# Patient Record
Sex: Male | Born: 1990 | Race: Black or African American | Hispanic: No | Marital: Married | Smoking: Former smoker
Health system: Southern US, Community
[De-identification: ages and names within clinical notes are randomized; demographics above are authoritative.]

---

## 2019-09-10 ENCOUNTER — Emergency Department
Admission: EM | Admit: 2019-09-10 | Discharge: 2019-09-10 | Disposition: A | Discharge status: Home / Self Care | Payer: Self-pay | Attending: Emergency Medicine | Admitting: Emergency Medicine

## 2019-09-10 ENCOUNTER — Other Ambulatory Visit: Payer: Self-pay

## 2019-09-10 DIAGNOSIS — R079 Chest pain, unspecified: Secondary | ICD-10-CM

## 2019-09-10 DIAGNOSIS — K2900 Acute gastritis without bleeding: Secondary | ICD-10-CM | POA: Insufficient documentation

## 2019-09-10 LAB — COMPREHENSIVE METABOLIC PANEL
ALT: 21 U/L (ref 0–44)
AST: 21 U/L (ref 15–41)
Albumin: 4.1 g/dL (ref 3.5–5.0)
Alkaline Phosphatase: 65 U/L (ref 38–126)
Anion gap: 7 (ref 5–15)
BUN: 11 mg/dL (ref 6–20)
CO2: 28 mmol/L (ref 22–32)
Calcium: 9.1 mg/dL (ref 8.9–10.3)
Chloride: 105 mmol/L (ref 98–111)
Creatinine, Ser: 1.21 mg/dL (ref 0.61–1.24)
GFR calc Af Amer: >60 mL/min (ref >60)
GFR calc non Af Amer: >60 mL/min (ref >60)
Glucose, Bld: 85 mg/dL (ref 70–99)
Potassium: 3.7 mmol/L (ref 3.5–5.1)
Sodium: 140 mmol/L (ref 135–145)
Total Bilirubin: 0.5 mg/dL (ref 0.3–1.2)
Total Protein: 6.9 g/dL (ref 6.5–8.1)

## 2019-09-10 LAB — CBC
HCT: 42.5 % (ref 39.0–52.0)
Hemoglobin: 14.1 g/dL (ref 13.0–17.0)
MCH: 29.7 pg (ref 26.0–34.0)
MCHC: 33.2 g/dL (ref 30.0–36.0)
MCV: 89.5 fL (ref 80.0–100.0)
Platelets: 307 10*3/uL (ref 150–400)
RBC: 4.75 MIL/uL (ref 4.22–5.81)
RDW: 13.4 % (ref 11.5–15.5)
WBC: 5.5 10*3/uL (ref 4.0–10.5)
nRBC: 0 % (ref 0.0–0.2)

## 2019-09-10 LAB — TROPONIN I (HIGH SENSITIVITY): Troponin I (High Sensitivity): 4 ng/L (ref <18)

## 2019-09-10 MED ORDER — PANTOPRAZOLE SODIUM 40 MG PO TBEC: 40.0000 mg | DELAYED_RELEASE_TABLET | Freq: Every day | ORAL | 1 refills | Status: DC

## 2019-09-10 NOTE — ED Provider Notes (Signed)
[facility] Emergency Department Provider Note  Time seen: 8:37 PM  I have reviewed the triage vital signs and the nursing notes.   HISTORY  Chief Complaint Chest Pain   HPI Kevin Mccormick is a 28 y.o. male patient presents to the emergency department for 1 to 2 weeks of intermittent chest pain.  According to the patient for the past 2 weeks or so he has been experiencing mild chest discomfort she describes as a burning sensation worse when he lies down or after eating hot sauce.  Denies any discomfort currently.  Denies any shortness of breath cough or fever.  States symptoms are worse at night and he feels like he has to vomit some mornings.   No past medical history on file.  There are no active problems to display for this patient.    Prior to Admission medications   Not on File    Not on File  No family history on file.  Social History Social History   Tobacco Use  . Smoking status: Not on file  Substance Use Topics  . Alcohol use: Not on file  . Drug use: Not on file    Review of Systems Constitutional: Negative for fever. Cardiovascular: Mild chest burning at night Respiratory: Negative for shortness of breath. Gastrointestinal: Negative for abdominal pain Musculoskeletal: Negative for musculoskeletal complaints Skin: Negative for skin complaints  Neurological: Negative for headache All other ROS negative  ____________________________________________   PHYSICAL EXAM:  VITAL SIGNS: ED Triage Vitals [09/10/19 1948]  Enc Vitals Group     BP (!) 151/78     Pulse Rate 62     Resp 20     Temp 98.5 F (36.9 C)     Temp Source Oral     SpO2 98 %     Weight 185 lb (83.9 kg)     Height 5\' 6"  (1.676 m)     Head Circumference      Peak Flow      Pain Score 3     Pain Loc      Pain Edu?      Excl. in [place]?     Constitutional: Alert and oriented. Well appearing and in no distress. Eyes: Normal exam ENT      Head:  Normocephalic and atraumatic.      Mouth/Throat: Mucous membranes are moist. Cardiovascular: Normal rate, regular rhythm.  Respiratory: Normal respiratory effort without tachypnea nor retractions. Breath sounds are clear Gastrointestinal: Soft and nontender. No distention.   Musculoskeletal: Nontender with normal range of motion in all extremities. Neurologic:  Normal speech and language. No gross focal neurologic deficit Skin:  Skin is warm, dry and intact.  Psychiatric: Mood and affect are normal.   ____________________________________________    EKG  EKG viewed and interpreted by myself shows a normal sinus rhythm at 69 bpm with a narrow QRS, normal axis, normal intervals, no concerning ST changes.  ____________________________________________  INITIAL IMPRESSION / ASSESSMENT AND PLAN / ED COURSE  Pertinent labs & imaging results that were available during my care of the patient were reviewed by me and considered in my medical decision making (see chart for details).   Patient presents emergency department for chest discomfort described as a burning sensation at night when he lies down, or after eating something spicy such as hot sauce per patient.  Patient also states nausea and feels like he needs to spit up or throw up first thing in the morning.  Patient symptoms are  very consistent with gastritis.  We will place the patient on Protonix I also discussed using over-the-counter Maalox after meals and right before going to bed for the next 1 week.  Patient agreeable to plan of care.  Discussed my normal chest pain return precautions.  Kevin Mccormick was evaluated in Emergency Department on 09/10/2019 for the symptoms described in the history of present illness. He was evaluated in the context of the global COVID-19 pandemic, which necessitated consideration that the patient might be at risk for infection with the SARS-CoV-2 virus that causes COVID-19. Institutional protocols and  algorithms that pertain to the evaluation of patients at risk for COVID-19 are in a state of rapid change based on information released by regulatory bodies including the CDC and federal and state organizations. These policies and algorithms were followed during the patient's care in the ED.  ____________________________________________   FINAL CLINICAL IMPRESSION(S) / ED DIAGNOSES  Chest pain Gastritis   Minna Antis, MD 09/10/19 2039

## 2019-09-10 NOTE — ED Triage Notes (Signed)
Pt in with co chest pain states thinks it is reflux because it gets better after taking tums. Denies any hx of reflux or recent illness.

## 2020-03-04 ENCOUNTER — Other Ambulatory Visit: Payer: Self-pay

## 2020-03-04 ENCOUNTER — Emergency Department
Admission: EM | Admit: 2020-03-04 | Discharge: 2020-03-04 | Disposition: A | Discharge status: Home / Self Care | Payer: Self-pay | Attending: Emergency Medicine | Admitting: Emergency Medicine

## 2020-03-04 ENCOUNTER — Emergency Department: Payer: Self-pay

## 2020-03-04 DIAGNOSIS — R05 Cough: Secondary | ICD-10-CM | POA: Insufficient documentation

## 2020-03-04 DIAGNOSIS — R059 Cough, unspecified: Secondary | ICD-10-CM

## 2020-03-04 DIAGNOSIS — Z79899 Other long term (current) drug therapy: Secondary | ICD-10-CM | POA: Insufficient documentation

## 2020-03-04 MED ORDER — METHYLPREDNISOLONE 4 MG PO TBPK: ORAL_TABLET | ORAL | 0 refills | Status: DC

## 2020-03-04 MED ORDER — BENZONATATE 100 MG PO CAPS: 200.0000 mg | ORAL_CAPSULE | Freq: Three times a day (TID) | ORAL | 0 refills | Status: DC | PRN

## 2020-03-04 NOTE — Discharge Instructions (Addendum)
Follow discharge care instruction take medication as directed. °

## 2020-03-04 NOTE — ED Triage Notes (Signed)
Pt c/o a dry cough for the past month and a half, states he has been taking OTC allergy meds with no relief., pt is in NAD.Kevin Mccormick

## 2020-03-04 NOTE — ED Notes (Signed)
See triage note  Presents with dry cough which started several months ago  Denies any fever

## 2020-03-04 NOTE — ED Provider Notes (Signed)
[facility] Emergency Department Provider Note   ____________________________________________   First MD Initiated Contact with Patient 03/04/20 [phone]     (approximate)  I have reviewed the triage vital signs and the nursing notes.   HISTORY  Chief Complaint Cough    HPI Kevin Mccormick is a 29 y.o. male patient complaining nonproductive cough for 1-1/2 months.  Patient states taking over-the-counter allergy medicine with no relief.  Patient denies fever or chills associated complaint.  Denies shortness of breath or chest pain.  Patient denies any other URI signs.  Patient stated tested Covid negative less than 14 days ago.  Denies history of asthma.      History reviewed. No pertinent past medical history.  There are no problems to display for this patient.   History reviewed. No pertinent surgical history.  Prior to Admission medications   Medication Sig Start Date End Date Taking? Authorizing Provider  benzonatate (TESSALON PERLES) 100 MG capsule Take 2 capsules (200 mg total) by mouth 3 (three) times daily as needed. 03/04/20 03/04/21  Joni Reining, PA-C  methylPREDNISolone (MEDROL DOSEPAK) 4 MG TBPK tablet Take Tapered dose as directed 03/04/20   Joni Reining, PA-C  pantoprazole (PROTONIX) 40 MG tablet Take 1 tablet (40 mg total) by mouth daily. 09/10/19 09/09/20  Minna Antis, MD    Allergies Patient has no known allergies.  No family history on file.  Social History Social History   Tobacco Use  . Smoking status: Never Smoker  . Smokeless tobacco: Never Used  Substance Use Topics  . Alcohol use: Not Currently  . Drug use: Not Currently    Review of Systems Constitutional: No fever/chills Eyes: No visual changes. ENT: No sore throat. Cardiovascular: Denies chest pain. Respiratory: Denies shortness of breath.  Nonproductive cough. Gastrointestinal: No abdominal pain.  No nausea, no vomiting.  No diarrhea.  No  constipation. Genitourinary: Negative for dysuria. Musculoskeletal: Negative for back pain. Skin: Negative for rash. Neurological: Negative for headaches, focal weakness or numbness.   ____________________________________________   PHYSICAL EXAM:  VITAL SIGNS: ED Triage Vitals  Enc Vitals Group     BP 03/04/20 0935 130/75     Pulse Rate 03/04/20 0935 65     Resp 03/04/20 0935 15     Temp 03/04/20 0935 97.7 F (36.5 C)     Temp Source 03/04/20 0935 Oral     SpO2 03/04/20 0935 99 %     Weight 03/04/20 0936 180 lb (81.6 kg)     Height 03/04/20 0936 5\' 6"  (1.676 m)     Head Circumference --      Peak Flow --      Pain Score 03/04/20 0936 0     Pain Loc --      Pain Edu? --      Excl. in [place]? --    Constitutional: Alert and oriented. Well appearing and in no acute distress. Nose: No congestion/rhinnorhea. Mouth/Throat: Mucous membranes are moist.  Oropharynx non-erythematous. Neck: No stridor.  Hematological/Lymphatic/Immunilogical: No cervical lymphadenopathy. Cardiovascular: Normal rate, regular rhythm. Grossly normal heart sounds.  Good peripheral circulation. Respiratory: Normal respiratory effort.  No retractions. Lungs CTAB. Neurologic:  Normal speech and language. No gross focal neurologic deficits are appreciated. No gait instability. Skin:  Skin is warm, dry and intact. No rash noted. Psychiatric: Mood and affect are normal. Speech and behavior are normal.  ____________________________________________   LABS (all labs ordered are listed, but only abnormal results are displayed)  Labs Reviewed -  No data to display ____________________________________________  EKG   ____________________________________________  RADIOLOGY  ED MD interpretation:    Official radiology report(s): DG Chest 2 View  Result Date: 03/04/2020 CLINICAL DATA:  Cough for 1 month EXAM: CHEST - 2 VIEW COMPARISON:  None. FINDINGS: There is slight scarring in the right upper lobe. Lungs  elsewhere are clear. Heart size and pulmonary vascularity are normal. No adenopathy. No bone lesions. IMPRESSION: Slight scarring right upper lobe. Lungs elsewhere clear. Cardiac silhouette normal. Electronically Signed   By: Lowella Grip III M.D.   On: 03/04/2020 10:29    ____________________________________________   PROCEDURES  Procedure(s) performed (including Critical Care):  Procedures   ____________________________________________   INITIAL IMPRESSION / ASSESSMENT AND PLAN / ED COURSE  As part of my medical decision making, I reviewed the following data within the [place]     Patient presents with nonproductive cough for 1/2 months.  Physical exam was unremarkable.  No coughing throughout exam.  Discussed x-ray findings with patient showing mild scarring in the right upper lobe.  Patient given discharge care instruction.  Patient given a prescription for Tessalon and Medrol Dosepak.  Patient advised [facility] care with [facility].    Kevin Mccormick was evaluated in Emergency Department on 03/04/2020 for the symptoms described in the history of present illness. He was evaluated in the context of the global COVID-19 pandemic, which necessitated consideration that the patient might be at risk for infection with the SARS-CoV-2 virus that causes COVID-19. Institutional protocols and algorithms that pertain to the evaluation of patients at risk for COVID-19 are in a state of rapid change based on information released by regulatory bodies including the CDC and federal and state organizations. These policies and algorithms were followed during the patient's care in the ED.       ____________________________________________   FINAL CLINICAL IMPRESSION(S) / ED DIAGNOSES  Final diagnoses:  Cough     ED Discharge Orders         Ordered    methylPREDNISolone (MEDROL DOSEPAK) 4 MG TBPK tablet     03/04/20 1042    benzonatate (TESSALON PERLES) 100 MG  capsule  3 times daily PRN     03/04/20 1042           Note:  This document was prepared using Dragon voice recognition software and may include unintentional dictation errors.    Sable Feil, PA-C 03/04/20 1044    Arta Silence, MD 03/04/20 [phone]

## 2020-03-10 ENCOUNTER — Emergency Department: Payer: Self-pay

## 2020-03-10 ENCOUNTER — Encounter: Payer: Self-pay | Admitting: Emergency Medicine

## 2020-03-10 ENCOUNTER — Emergency Department
Admission: EM | Admit: 2020-03-10 | Discharge: 2020-03-10 | Disposition: A | Discharge status: Home / Self Care | Payer: Self-pay | Attending: Emergency Medicine | Admitting: Emergency Medicine

## 2020-03-10 ENCOUNTER — Other Ambulatory Visit: Payer: Self-pay

## 2020-03-10 DIAGNOSIS — Z20822 Contact with and (suspected) exposure to covid-19: Secondary | ICD-10-CM | POA: Insufficient documentation

## 2020-03-10 DIAGNOSIS — J029 Acute pharyngitis, unspecified: Secondary | ICD-10-CM

## 2020-03-10 DIAGNOSIS — Z79899 Other long term (current) drug therapy: Secondary | ICD-10-CM | POA: Insufficient documentation

## 2020-03-10 DIAGNOSIS — J069 Acute upper respiratory infection, unspecified: Secondary | ICD-10-CM

## 2020-03-10 LAB — GROUP A STREP BY PCR: Group A Strep by PCR: NOT DETECTED

## 2020-03-10 LAB — SARS CORONAVIRUS 2 (TAT 6-24 HRS): SARS Coronavirus 2: NEGATIVE

## 2020-03-10 MED ORDER — LIDOCAINE VISCOUS HCL 2 % MT SOLN: 5.0000 mL | Freq: Four times a day (QID) | OROMUCOSAL | 0 refills | Status: DC | PRN

## 2020-03-10 MED ORDER — PSEUDOEPH-BROMPHEN-DM 30-2-10 MG/5ML PO SYRP: 5.0000 mL | ORAL_SOLUTION | Freq: Four times a day (QID) | ORAL | 0 refills | Status: DC | PRN

## 2020-03-10 NOTE — ED Notes (Signed)
See triage note  Presents with sore throat and joint pain   States he was placed on prednisone last week but stopped taking it  B/c it was bothering his joints   States he now has a sore throat afebrile on arrival

## 2020-03-10 NOTE — ED Triage Notes (Signed)
Pt reports his daughter has strep throat and he wants to be checked for it. Pt c/o sore throat, denies fever. Pt also reports he has been on a steroid for about 2 weeks and wants the MD to take him off of it. Pt also reports he was given a cough medication 1-2 weeks ago and it is not working so he needs something else.

## 2020-03-10 NOTE — ED Provider Notes (Signed)
[facility] Emergency Department Provider Note   ____________________________________________   First MD Initiated Contact with Patient 03/10/20 0815     (approximate)  I have reviewed the triage vital signs and the nursing notes.   HISTORY  Chief Complaint Sore Throat, Allergic Reaction, and Cough    HPI Kevin Mccormick is a 29 y.o. male patient complain of sore throat for approximately 1 week.  Patient states his daughter was diagnosed with strep pharyngitis.  Patient also complained of persisting cough since last visit on March 04, 2020.  Patient state cannot tolerate steroids that were given yesterday's visit.  Patient also states no relief with Tessalon Perles.  Patient denies recent travel or exposure to COVID-19.         History reviewed. No pertinent past medical history.  There are no problems to display for this patient.   History reviewed. No pertinent surgical history.  Prior to Admission medications   Medication Sig Start Date End Date Taking? Authorizing Provider  benzonatate (TESSALON PERLES) 100 MG capsule Take 2 capsules (200 mg total) by mouth 3 (three) times daily as needed. 03/04/20 03/04/21  Sable Feil, PA-C  brompheniramine-pseudoephedrine-DM 30-2-10 MG/5ML syrup Take 5 mLs by mouth 4 (four) times daily as needed. Mix with 5 mL of viscous lidocaine for swish and swallow 03/10/20   Sable Feil, PA-C  lidocaine (XYLOCAINE) 2 % solution Use as directed 5 mLs in the mouth or throat every 6 (six) hours as needed for mouth pain. Mix with 5 mL of Bromfed-DM for swish and swallow. 03/10/20   Sable Feil, PA-C  methylPREDNISolone (MEDROL DOSEPAK) 4 MG TBPK tablet Take Tapered dose as directed 03/04/20   Sable Feil, PA-C  pantoprazole (PROTONIX) 40 MG tablet Take 1 tablet (40 mg total) by mouth daily. 09/10/19 09/09/20  Harvest Dark, MD    Allergies Patient has no known allergies.  No family history on file.  Social  History Social History   Tobacco Use  . Smoking status: Never Smoker  . Smokeless tobacco: Never Used  Substance Use Topics  . Alcohol use: Not Currently  . Drug use: Not Currently    Review of Systems Constitutional: No fever/chills Eyes: No visual changes. ENT: No sore throat. Cardiovascular: Denies chest pain. Respiratory: Denies shortness of breath.  Nonproductive cough. Gastrointestinal: No abdominal pain.  No nausea, no vomiting.  No diarrhea.  No constipation. Genitourinary: Negative for dysuria. Musculoskeletal: Negative for back pain. Skin: Negative for rash. Neurological: Negative for headaches, focal weakness or numbness.   ____________________________________________   PHYSICAL EXAM:  VITAL SIGNS: ED Triage Vitals  Enc Vitals Group     BP 03/10/20 0806 134/82     Pulse Rate 03/10/20 0806 70     Resp 03/10/20 0806 16     Temp 03/10/20 0806 98.4 F (36.9 C)     Temp src --      SpO2 03/10/20 0806 99 %     Weight 03/10/20 0802 180 lb (81.6 kg)     Height 03/10/20 0802 5\' 6"  (1.676 m)     Head Circumference --      Peak Flow --      Pain Score 03/10/20 0802 3     Pain Loc --      Pain Edu? --      Excl. in [place]? --     Constitutional: Alert and oriented. Well appearing and in no acute distress. Nose: No congestion/rhinnorhea.  Clear rhinorrhea. Mouth/Throat: Mucous  membranes are moist.  Oropharynx non-erythematous.  Postnasal drainage. Neck: No stridor.   Hematological/Lymphatic/Immunilogical: No cervical lymphadenopathy. Cardiovascular: Normal rate, regular rhythm. Grossly normal heart sounds.  Good peripheral circulation. Respiratory: Normal respiratory effort.  No retractions. Lungs CTAB. Skin:  Skin is warm, dry and intact. No rash noted. Psychiatric: Mood and affect are normal. Speech and behavior are normal.  ____________________________________________   LABS (all labs ordered are listed, but only abnormal results are displayed)  Labs  Reviewed  GROUP A STREP BY PCR  SARS CORONAVIRUS 2 (TAT 6-24 HRS)   ____________________________________________  EKG   ____________________________________________  RADIOLOGY  ED MD interpretation:    Official radiology report(s): DG Chest Portable 1 View  Result Date: 03/10/2020 CLINICAL DATA:  Cough EXAM: PORTABLE CHEST 1 VIEW COMPARISON:  March 04, 2020 FINDINGS: The lungs are clear. Heart size and pulmonary vascularity are normal. No adenopathy. No bone lesions. IMPRESSION: Lungs clear.  Cardiac silhouette. Electronically Signed   By: Bretta Bang III M.D.   On: 03/10/2020 08:36    ____________________________________________   PROCEDURES  Procedure(s) performed (including Critical Care):  Procedures   ____________________________________________   INITIAL IMPRESSION / ASSESSMENT AND PLAN / ED COURSE  As part of my medical decision making, I reviewed the following data within the electronic MEDICAL RECORD NUMBER     Patient presents with sore throat secondary to strep exposure.  Patient also complaining of persistent nonproductive cough since last visit.  Discussed negative strep results with patient.  Patient feels exam consistent with viral pharyngitis and respiratory infection.  Patient given discharge care instruct advised take medication as directed.  Patient advised [facility] with [facility].  Patient advised self quarantine pending results of COVID-19 test.  If test is positive was quarantine additional 10 days.    Kevin Mccormick was evaluated in Emergency Department on 03/10/2020 for the symptoms described in the history of present illness. He was evaluated in the context of the global COVID-19 pandemic, which necessitated consideration that the patient might be at risk for infection with the SARS-CoV-2 virus that causes COVID-19. Institutional protocols and algorithms that pertain to the evaluation of patients at risk for COVID-19 are in a state of  rapid change based on information released by regulatory bodies including the CDC and federal and state organizations. These policies and algorithms were followed during the patient's care in the ED.       ____________________________________________   FINAL CLINICAL IMPRESSION(S) / ED DIAGNOSES  Final diagnoses:  Viral URI with cough  Sore throat     ED Discharge Orders         Ordered    brompheniramine-pseudoephedrine-DM 30-2-10 MG/5ML syrup  4 times daily PRN     03/10/20 0920    lidocaine (XYLOCAINE) 2 % solution  Every 6 hours PRN     03/10/20 0920           Note:  This document was prepared using Dragon voice recognition software and may include unintentional dictation errors.    Joni Reining, PA-C 03/10/20 1884    Minna Antis, MD 03/10/20 1350

## 2020-03-10 NOTE — Discharge Instructions (Signed)
Follow discharge care instruction take medication as directed.  Advised self quarantine pending results of COVID-19 test.  If test is positive was quarantine additional 10 days.  You will be notified telephonically if test is positive.  You may also follow the results of your COVID-19 testing in the MyChart app.

## 2020-04-04 ENCOUNTER — Other Ambulatory Visit: Payer: Self-pay

## 2020-04-04 ENCOUNTER — Encounter: Payer: Self-pay | Admitting: Pulmonary Disease

## 2020-04-04 ENCOUNTER — Ambulatory Visit (INDEPENDENT_AMBULATORY_CARE_PROVIDER_SITE_OTHER): Payer: Self-pay | Admitting: Pulmonary Disease

## 2020-04-04 VITALS — BP 110/70 | HR 67 | Temp 97.0°F | Ht 66.0 in | Wt 178.4 lb

## 2020-04-04 DIAGNOSIS — R059 Cough, unspecified: Secondary | ICD-10-CM

## 2020-04-04 DIAGNOSIS — R05 Cough: Secondary | ICD-10-CM

## 2020-04-04 NOTE — Progress Notes (Signed)
Kevin Mccormick    [id number]    January 28, 1991  Primary Care Physician:Patient, No Pcp Per  Referring Physician: No referring provider defined for this encounter.  Chief complaint:   Patient with a history of recent shortness of breath   HPI:  Possible pneumonia 3 months ago Was in the emergency room for shortness of breath where he was treated with steroids, cough suppressants  He is feeling better Cough is better Activity level is better  Denies underlying lung disease no history of asthma  Was not on any medications prior  Claims this quit smoking many years ago over 6-7 years ago  No family history of lung disease No significant work exposures-he works at [employer] zone, he does wear mask  Outpatient Encounter Medications as of 04/04/2020  Medication Sig  . benzonatate (TESSALON PERLES) 100 MG capsule Take 2 capsules (200 mg total) by mouth 3 (three) times daily as needed.  . brompheniramine-pseudoephedrine-DM 30-2-10 MG/5ML syrup Take 5 mLs by mouth 4 (four) times daily as needed. Mix with 5 mL of viscous lidocaine for swish and swallow  . lidocaine (XYLOCAINE) 2 % solution Use as directed 5 mLs in the mouth or throat every 6 (six) hours as needed for mouth pain. Mix with 5 mL of Bromfed-DM for swish and swallow.  . methylPREDNISolone (MEDROL DOSEPAK) 4 MG TBPK tablet Take Tapered dose as directed  . pantoprazole (PROTONIX) 40 MG tablet Take 1 tablet (40 mg total) by mouth daily.   No facility-administered encounter medications on file as of 04/04/2020.    Allergies as of 04/04/2020  . (No Known Allergies)    History reviewed. No pertinent past medical history.  History reviewed. No pertinent surgical history.  History reviewed. No pertinent family history.  Social History   Socioeconomic History  . Marital status: Married    Spouse name: Not on file  . Number of children: Not on file  . Years of education: Not on file  . Highest education level: Not on  file  Occupational History  . Not on file  Tobacco Use  . Smoking status: Current Every Day Smoker  . Smokeless tobacco: Never Used  Substance and Sexual Activity  . Alcohol use: Not Currently  . Drug use: Not Currently  . Sexual activity: Not on file  Other Topics Concern  . Not on file  Social History Narrative  . Not on file   Social Determinants of Health   Financial Resource Strain:   . Difficulty of Paying Living Expenses:   Food Insecurity:   . Worried About Charity fundraiser in the Last Year:   . Arboriculturist in the Last Year:   Transportation Needs:   . Film/video editor (Medical):   Marland Kitchen Lack of Transportation (Non-Medical):   Physical Activity:   . Days of Exercise per Week:   . Minutes of Exercise per Session:   Stress:   . Feeling of Stress :   Social Connections:   . Frequency of Communication with Friends and Family:   . Frequency of Social Gatherings with Friends and Family:   . Attends Religious Services:   . Active Member of Clubs or Organizations:   . Attends Archivist Meetings:   Marland Kitchen Marital Status:   Intimate Partner Violence:   . Fear of Current or Ex-Partner:   . Emotionally Abused:   Marland Kitchen Physically Abused:   . Sexually Abused:     Review of  Systems  Respiratory: Positive for cough and shortness of breath.   All other systems reviewed and are negative.   Vitals:   04/04/20 0939  BP: 110/70  Pulse: 67  Temp: (!) 97 F (36.1 C)  SpO2: 97%     Physical Exam  Constitutional: He appears well-developed and well-nourished.  HENT:  Head: Normocephalic and atraumatic.  Eyes: Pupils are equal, round, and reactive to light.  Neck: No thyromegaly present.  Cardiovascular: Normal rate and regular rhythm.  Pulmonary/Chest: Effort normal and breath sounds normal. No respiratory distress. He has no wheezes. He has no rales. He exhibits no tenderness.   Data Reviewed: Recent chest x-ray from 03/10/2020 reviewed by  myself  Assessment:  Cough -Post infectious cough -Improvement in symptoms  Plan/Recommendations: Marland Kitchen  May use Delsym-over-the-counter cough medicine to help with cough suppression .  If he requires any other medication for his cough, encouraged to call  .  If symptoms do not continue to improve will be glad to follow-up in a few weeks .  For now follow-up as needed  .  Graded exercises .  Avoid situations that may predispose to lung disease    Virl Diamond MD [facility] 04/04/2020, 9:59 AM  CC: No ref. provider found

## 2020-04-04 NOTE — Patient Instructions (Signed)
Cough No concerning findings on your chest x-ray  Symptoms are improving at present time Continue to monitor symptoms closely  Delsym-over-the-counter cough medicine may help suppress cough  Call with significant concerns  I will see you as needed

## 2020-05-23 ENCOUNTER — Other Ambulatory Visit: Payer: Self-pay

## 2020-05-23 ENCOUNTER — Emergency Department
Admission: EM | Admit: 2020-05-23 | Discharge: 2020-05-23 | Disposition: A | Discharge status: Home / Self Care | Payer: Self-pay | Attending: Emergency Medicine | Admitting: Emergency Medicine

## 2020-05-23 DIAGNOSIS — M67432 Ganglion, left wrist: Secondary | ICD-10-CM | POA: Insufficient documentation

## 2020-05-23 DIAGNOSIS — F17200 Nicotine dependence, unspecified, uncomplicated: Secondary | ICD-10-CM | POA: Insufficient documentation

## 2020-05-23 NOTE — Discharge Instructions (Addendum)
Wear the wrist splint to calm down the inflammation around the ganglionic cyst.  Take Tylenol or ibuprofen for pain if needed.  Apply ice.  Return if worsening.  Follow-up with orthopedics if not improving in 5 days

## 2020-05-23 NOTE — ED Provider Notes (Signed)
[facility] Emergency Department Provider Note  ____________________________________________   First MD Initiated Contact with Patient 05/23/20 2210     (approximate)  I have reviewed the triage vital signs and the nursing notes.   HISTORY  Chief Complaint Cyst    HPI Kevin Mccormick is a 29 y.o. male presents emergency department complaining of left wrist pain.  Patient has history of ganglion cyst is gotten a little larger recently.  Patient states that is pulling on the muscles that lead into his armpit.  Sometimes his fingers will go numb.  Pain is rated at  8 of 10   History reviewed. No pertinent past medical history.  There are no problems to display for this patient.   History reviewed. No pertinent surgical history.  Prior to Admission medications   Medication Sig Start Date End Date Taking? Authorizing Provider  pantoprazole (PROTONIX) 40 MG tablet Take 1 tablet (40 mg total) by mouth daily. 09/10/19 05/23/20  Minna Antis, MD    Allergies Patient has no known allergies.  No family history on file.  Social History Social History   Tobacco Use  . Smoking status: Current Every Day Smoker  . Smokeless tobacco: Never Used  Substance Use Topics  . Alcohol use: Not Currently  . Drug use: Not Currently    Review of Systems  Constitutional: No fever/chills Eyes: No visual changes. ENT: No sore throat. Respiratory: Denies cough Genitourinary: Negative for dysuria. Musculoskeletal: Negative for back pain.  Positive for left wrist pain Skin: Negative for rash. Psychiatric: no mood changes,     ____________________________________________   PHYSICAL EXAM:  VITAL SIGNS: ED Triage Vitals [05/23/20 2202]  Enc Vitals Group     BP 133/72     Pulse Rate 69     Resp 16     Temp 98 F (36.7 C)     Temp src      SpO2 98 %     Weight 180 lb (81.6 kg)     Height 5\' 6"  (1.676 m)     Head Circumference      Peak Flow       Pain Score 8     Pain Loc      Pain Edu?      Excl. in GC?     Constitutional: Alert and oriented. Well appearing and in no acute distress. Eyes: Conjunctivae are normal.  Head: Atraumatic. Nose: No congestion/rhinnorhea. Mouth/Throat: Mucous membranes are moist.   Neck:  supple no lymphadenopathy noted Cardiovascular: Normal rate, regular rhythm.  Respiratory: Normal respiratory effort.  No retractions, GU: deferred Musculoskeletal: FROM all extremities, warm and well perfused, positive for ganglion cyst noted to the dorsum of the left wrist, area is mobile and tender, neurovascular is intact, no edema noted distally.  Left axillary area does not show any nodes or tenderness into the shoulder. Neurologic:  Normal speech and language.  Skin:  Skin is warm, dry and intact. No rash noted. Psychiatric: Mood and affect are normal. Speech and behavior are normal.  ____________________________________________   LABS (all labs ordered are listed, but only abnormal results are displayed)  Labs Reviewed - No data to display ____________________________________________   ____________________________________________  RADIOLOGY    ____________________________________________   PROCEDURES  Procedure(s) performed: Wrist splint applied by nursing staff  Procedures    ____________________________________________   INITIAL IMPRESSION / ASSESSMENT AND PLAN / ED COURSE  Pertinent labs & imaging results that were available during my care of the patient were  reviewed by me and considered in my medical decision making (see chart for details).   Patient is 29 year old male presents emergency department left wrist pain.  See HPI  Physical exam is consistent with a ganglion cyst.  I did explain everything to the patient.  Is placed in a wrist splint today help alleviate some of the discomfort.  He is to follow-up with orthopedics.  He is to take over-the-counter ibuprofen or Tylenol  for pain.  Return if worsening.  He was discharged stable condition.    Kevin Mccormick was evaluated in Emergency Department on 05/23/2020 for the symptoms described in the history of present illness. He was evaluated in the context of the global COVID-19 pandemic, which necessitated consideration that the patient might be at risk for infection with the SARS-CoV-2 virus that causes COVID-19. Institutional protocols and algorithms that pertain to the evaluation of patients at risk for COVID-19 are in a state of rapid change based on information released by regulatory bodies including the CDC and federal and state organizations. These policies and algorithms were followed during the patient's care in the ED.   As part of my medical decision making, I reviewed the following data within the [place] notes reviewed and incorporated, Old chart reviewed, Notes from prior ED visits and [place] Controlled Substance Database  ____________________________________________   FINAL CLINICAL IMPRESSION(S) / ED DIAGNOSES  Final diagnoses:  Ganglion of left wrist      NEW MEDICATIONS STARTED DURING THIS VISIT:  Current Discharge Medication List       Note:  This document was prepared using Dragon voice recognition software and may include unintentional dictation errors.    Versie Starks, PA-C 05/23/20 2224    Nance Pear, MD 05/23/20 2236

## 2020-05-23 NOTE — ED Triage Notes (Signed)
Pt to the er for a cyst to the left wrist with pain. Pt states he feels like he pulled a muscle under his arm as well.

## 2020-05-27 ENCOUNTER — Emergency Department: Payer: Worker's Compensation

## 2020-05-27 ENCOUNTER — Other Ambulatory Visit: Payer: Self-pay

## 2020-05-27 ENCOUNTER — Emergency Department
Admission: EM | Admit: 2020-05-27 | Discharge: 2020-05-27 | Disposition: A | Discharge status: Home / Self Care | Payer: Worker's Compensation | Attending: Emergency Medicine | Admitting: Emergency Medicine

## 2020-05-27 DIAGNOSIS — Y92410 Unspecified street and highway as the place of occurrence of the external cause: Secondary | ICD-10-CM | POA: Insufficient documentation

## 2020-05-27 DIAGNOSIS — Y999 Unspecified external cause status: Secondary | ICD-10-CM | POA: Diagnosis not present

## 2020-05-27 DIAGNOSIS — M545 Low back pain: Secondary | ICD-10-CM | POA: Insufficient documentation

## 2020-05-27 DIAGNOSIS — M546 Pain in thoracic spine: Secondary | ICD-10-CM | POA: Diagnosis not present

## 2020-05-27 DIAGNOSIS — F17200 Nicotine dependence, unspecified, uncomplicated: Secondary | ICD-10-CM | POA: Diagnosis not present

## 2020-05-27 DIAGNOSIS — M542 Cervicalgia: Secondary | ICD-10-CM | POA: Insufficient documentation

## 2020-05-27 DIAGNOSIS — Y939 Activity, unspecified: Secondary | ICD-10-CM | POA: Diagnosis not present

## 2020-05-27 DIAGNOSIS — Z7901 Long term (current) use of anticoagulants: Secondary | ICD-10-CM | POA: Diagnosis not present

## 2020-05-27 MED ORDER — METHOCARBAMOL 500 MG PO TABS: 500.0000 mg | ORAL_TABLET | Freq: Three times a day (TID) | ORAL | 0 refills | Status: AC | PRN

## 2020-05-27 MED ORDER — MELOXICAM 15 MG PO TABS: 15.0000 mg | ORAL_TABLET | Freq: Every day | ORAL | 1 refills | Status: AC

## 2020-05-27 NOTE — ED Notes (Signed)
Spoke to Art therapist from [employer] upon request only urine drug screen WC.
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
## 2020-05-27 NOTE — Discharge Instructions (Signed)
Take meloxicam and Robaxin for pain and inflammation. °
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
## 2020-06-03 ENCOUNTER — Emergency Department
Admission: EM | Admit: 2020-06-03 | Discharge: 2020-06-03 | Disposition: A | Discharge status: Home / Self Care | Payer: Worker's Compensation | Attending: Emergency Medicine | Admitting: Emergency Medicine

## 2020-06-03 DIAGNOSIS — M62830 Muscle spasm of back: Secondary | ICD-10-CM | POA: Insufficient documentation

## 2020-06-03 DIAGNOSIS — M25519 Pain in unspecified shoulder: Secondary | ICD-10-CM | POA: Insufficient documentation

## 2020-06-03 DIAGNOSIS — M546 Pain in thoracic spine: Secondary | ICD-10-CM | POA: Diagnosis not present

## 2020-06-03 DIAGNOSIS — M549 Dorsalgia, unspecified: Secondary | ICD-10-CM

## 2020-06-03 DIAGNOSIS — M7918 Myalgia, other site: Secondary | ICD-10-CM

## 2020-06-03 MED ORDER — DIAZEPAM 5 MG PO TABS: 5.0000 mg | ORAL_TABLET | Freq: Three times a day (TID) | ORAL | 0 refills | Status: DC | PRN

## 2020-06-03 MED ORDER — DIAZEPAM 5 MG PO TABS
5.0000 mg | ORAL_TABLET | Freq: Once | ORAL | Status: AC
  Administered 2020-06-03: 5 mg via ORAL
  Filled 2020-06-03: qty 1
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
## 2020-06-17 ENCOUNTER — Telehealth: Payer: Self-pay | Admitting: General Practice

## 2020-06-17 NOTE — Telephone Encounter (Signed)
Individual has been contacted 3+ times regarding ED referral. No further attempts to contact individual will be made. 

## 2020-08-20 ENCOUNTER — Emergency Department
Admission: EM | Admit: 2020-08-20 | Discharge: 2020-08-20 | Disposition: A | Discharge status: Home / Self Care | Payer: Self-pay | Attending: Student in an Organized Health Care Education/Training Program | Admitting: Student in an Organized Health Care Education/Training Program

## 2020-08-20 ENCOUNTER — Emergency Department: Payer: Self-pay

## 2020-08-20 ENCOUNTER — Encounter: Payer: Self-pay | Admitting: Emergency Medicine

## 2020-08-20 ENCOUNTER — Other Ambulatory Visit: Payer: Self-pay

## 2020-08-20 DIAGNOSIS — F17210 Nicotine dependence, cigarettes, uncomplicated: Secondary | ICD-10-CM | POA: Insufficient documentation

## 2020-08-20 DIAGNOSIS — G8929 Other chronic pain: Secondary | ICD-10-CM | POA: Insufficient documentation

## 2020-08-20 DIAGNOSIS — M25512 Pain in left shoulder: Secondary | ICD-10-CM | POA: Insufficient documentation

## 2020-08-20 MED ORDER — CYCLOBENZAPRINE HCL 5 MG PO TABS: ORAL_TABLET | ORAL | 0 refills | Status: DC

## 2020-08-20 NOTE — Discharge Instructions (Signed)
You may have torn a muscle in your shoulder.  You can apply heat to your shoulder and take muscle relaxers.  Do not drive or work while taking muscle relaxers.  Please call orthopedics today for a follow-up appointment for a further work-up.
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
## 2020-08-20 NOTE — ED Triage Notes (Signed)
Presents with pain to left shoulder  Denies any injury  States developed pain about 6 months ago  No deformity noted  Good pulses

## 2021-05-04 ENCOUNTER — Emergency Department
Admission: EM | Admit: 2021-05-04 | Discharge: 2021-05-04 | Disposition: A | Discharge status: Home / Self Care | Payer: Self-pay | Attending: Emergency Medicine | Admitting: Emergency Medicine

## 2021-05-04 ENCOUNTER — Emergency Department: Payer: Self-pay

## 2021-05-04 ENCOUNTER — Other Ambulatory Visit: Payer: Self-pay

## 2021-05-04 ENCOUNTER — Encounter: Payer: Self-pay | Admitting: Emergency Medicine

## 2021-05-04 DIAGNOSIS — Z20822 Contact with and (suspected) exposure to covid-19: Secondary | ICD-10-CM | POA: Insufficient documentation

## 2021-05-04 DIAGNOSIS — Z28310 Unvaccinated for covid-19: Secondary | ICD-10-CM | POA: Insufficient documentation

## 2021-05-04 DIAGNOSIS — J069 Acute upper respiratory infection, unspecified: Secondary | ICD-10-CM | POA: Insufficient documentation

## 2021-05-04 DIAGNOSIS — F17200 Nicotine dependence, unspecified, uncomplicated: Secondary | ICD-10-CM | POA: Insufficient documentation

## 2021-05-04 LAB — SARS CORONAVIRUS 2 (TAT 6-24 HRS): SARS Coronavirus 2: NEGATIVE

## 2021-05-04 MED ORDER — IBUPROFEN 800 MG PO TABS: 800.0000 mg | ORAL_TABLET | Freq: Three times a day (TID) | ORAL | 0 refills | Status: DC | PRN

## 2021-05-04 MED ORDER — PSEUDOEPH-BROMPHEN-DM 30-2-10 MG/5ML PO SYRP: 5.0000 mL | ORAL_SOLUTION | Freq: Four times a day (QID) | ORAL | 0 refills | Status: DC | PRN

## 2021-05-04 NOTE — ED Provider Notes (Signed)
[facility] Emergency Department Provider Note   ____________________________________________   Event Date/Time   First MD Initiated Contact with Patient 05/04/21 1019     (approximate)  I have reviewed the triage vital signs and the nursing notes.   HISTORY  Chief Complaint URI    HPI Kevin Mccormick is a 30 y.o. male patient plan nonproductive cough for 2 weeks.  Patient states cough has been intermitting.  Patient stated last night he developed body aches and fever.  No known contact with COVID-19.  No recent travel.  Patient has not taken the vaccine for COVID-19 or influenza.  Patient rates his pain/discomfort a 5/10.  Patient describes his pain/discomfort as "achy".  No palliative measure for complaint.         History reviewed. No pertinent past medical history.  There are no problems to display for this patient.   History reviewed. No pertinent surgical history.  Prior to Admission medications   Medication Sig Start Date End Date Taking? Authorizing Provider  brompheniramine-pseudoephedrine-DM 30-2-10 MG/5ML syrup Take 5 mLs by mouth 4 (four) times daily as needed. 05/04/21  Yes Joni Reining, PA-C  ibuprofen (ADVIL) 800 MG tablet Take 1 tablet (800 mg total) by mouth every 8 (eight) hours as needed. 05/04/21  Yes Joni Reining, PA-C  pantoprazole (PROTONIX) 40 MG tablet Take 1 tablet (40 mg total) by mouth daily. 09/10/19 05/23/20  Minna Antis, MD    Allergies Patient has no known allergies.  No family history on file.  Social History Social History   Tobacco Use  . Smoking status: Current Every Day Smoker  . Smokeless tobacco: Never Used  Substance Use Topics  . Alcohol use: Not Currently  . Drug use: Not Currently    Review of Systems Constitutional: Fever and body aches. Eyes: No visual changes. ENT: No sore throat. Cardiovascular: Denies chest pain. Respiratory: Denies shortness of breath.  Nonproductive  cough. Gastrointestinal: No abdominal pain.  No nausea, no vomiting.  No diarrhea.  No constipation. Genitourinary: Negative for dysuria. Musculoskeletal: Negative for back pain. Skin: Negative for rash. Neurological: Negative for headaches, focal weakness or numbness.   ____________________________________________   PHYSICAL EXAM:  VITAL SIGNS: ED Triage Vitals  Enc Vitals Group     BP 05/04/21 0832 113/76     Pulse Rate 05/04/21 0832 99     Resp 05/04/21 0832 16     Temp 05/04/21 0832 99.1 F (37.3 C)     Temp Source 05/04/21 0832 Oral     SpO2 05/04/21 0832 100 %     Weight 05/04/21 0821 180 lb 12.4 oz (82 kg)     Height 05/04/21 0821 5\' 6"  (1.676 m)     Head Circumference --      Peak Flow --      Pain Score 05/04/21 0820 5     Pain Loc --      Pain Edu? --      Excl. in GC? --    Constitutional: Alert and oriented. Well appearing and in no acute distress. Eyes: Conjunctivae are normal. PERRL. EOMI. Head: Atraumatic. Nose: No congestion/rhinnorhea. Mouth/Throat: Mucous membranes are moist.  Oropharynx non-erythematous. Neck: No stridor. Hematological/Lymphatic/Immunilogical: No cervical lymphadenopathy. Cardiovascular: Normal rate, regular rhythm. Grossly normal heart sounds.  Good peripheral circulation. Respiratory: Normal respiratory effort.  No retractions. Lungs CTAB. Gastrointestinal: Soft and nontender. No distention. No abdominal bruits. No CVA tenderness. Musculoskeletal: No lower extremity tenderness nor edema.  No joint effusions. Neurologic:  Normal speech and language. No gross focal neurologic deficits are appreciated. No gait instability. Skin:  Skin is warm, dry and intact. No rash noted. Psychiatric: Mood and affect are normal. Speech and behavior are normal.  ____________________________________________   LABS (all labs ordered are listed, but only abnormal results are displayed)  Labs Reviewed  SARS CORONAVIRUS 2 (TAT 6-24 HRS)    ____________________________________________  EKG   ____________________________________________  RADIOLOGY I, Joni Reining, personally viewed and evaluated these images (plain radiographs) as part of my medical decision making, as well as reviewing the written report by the radiologist.  ED MD interpretation: No acute finding on X-ray of chest  Official radiology report(s): No results found.  ____________________________________________   PROCEDURES  Procedure(s) performed (including Critical Care):  Procedures   ____________________________________________   INITIAL IMPRESSION / ASSESSMENT AND PLAN / ED COURSE  As part of my medical decision making, I reviewed the following data within the electronic MEDICAL RECORD NUMBER         Patient presents acute onset fever and cought for one days. Compliant and physical exam is consistent with viral respiratory infection.  Patient given discharge care instructions and advised take medication as directed.  Patient advised self quarantine pending results of COVID-19 test.  If test is positive must quarantine additional 5 days per [facility] recommendations.      ____________________________________________   FINAL CLINICAL IMPRESSION(S) / ED DIAGNOSES  Final diagnoses:  Viral URI with cough     ED Discharge Orders         Ordered    brompheniramine-pseudoephedrine-DM 30-2-10 MG/5ML syrup  4 times daily PRN        05/04/21 1232    ibuprofen (ADVIL) 800 MG tablet  Every 8 hours PRN        05/04/21 1232           Note:  This document was prepared using Dragon voice recognition software and may include unintentional dictation errors.    Joni Reining, PA-C 05/06/21 1316    Chesley Noon, MD 05/06/21 [phone]

## 2021-05-04 NOTE — ED Notes (Signed)
See triage note  Presents with low grade temp with cough

## 2021-05-04 NOTE — ED Triage Notes (Signed)
C/O cough, body aches, fever x 1 day.  AAOx3.  Skin warm and dry. NAD

## 2021-05-04 NOTE — Discharge Instructions (Signed)
Your chest x-ray was negative for pneumonia or bronchitis.  Your COVID-19 test are pending.  Advised self quarantine pending results which should be later today available in the MyChart app.  If positive must quarantine for 10 days per [facility] recommendations as you are not vaccinated.

## 2022-04-21 ENCOUNTER — Emergency Department
Admission: EM | Admit: 2022-04-21 | Discharge: 2022-04-21 | Disposition: A | Discharge status: Home / Self Care | Payer: Self-pay | Attending: Emergency Medicine | Admitting: Emergency Medicine

## 2022-04-21 ENCOUNTER — Emergency Department: Payer: Self-pay

## 2022-04-21 ENCOUNTER — Encounter: Payer: Self-pay | Admitting: Medical Oncology

## 2022-04-21 DIAGNOSIS — M7989 Other specified soft tissue disorders: Secondary | ICD-10-CM | POA: Insufficient documentation

## 2022-04-21 DIAGNOSIS — R200 Anesthesia of skin: Secondary | ICD-10-CM | POA: Insufficient documentation

## 2022-04-21 DIAGNOSIS — M25512 Pain in left shoulder: Secondary | ICD-10-CM

## 2022-04-21 NOTE — ED Provider Triage Note (Signed)
Emergency Medicine Provider Triage Evaluation Note  Kevin Mccormick , a 31 y.o. male  was evaluated in triage.  Pt complains of bilateral shoulder numbness that gets worse at night. No known injury. Feels a bulge on the left side. No relief with Aleve or muscle relaxer.  Review of Systems  Positive: Bilateral shoulder pain/numbness Negative: Fever  Physical Exam  BP 128/86 (BP Location: Left Arm)   Pulse 60   Temp (!) 97.5 F (36.4 C)   Resp 18   SpO2 97%  Gen:   Awake, no distress   Resp:  Normal effort  MSK:   Moves extremities without difficulty  Other:    Medical Decision Making  Medically screening exam initiated at 1:29 PM.  Appropriate orders placed.  Kevin Mccormick was informed that the remainder of the evaluation will be completed by another provider, this initial triage assessment does not replace that evaluation, and the importance of remaining in the ED until their evaluation is complete.   Victorino Dike, FNP 04/21/22 1330

## 2022-04-21 NOTE — ED Provider Notes (Signed)
[facility] Provider Note    Event Date/Time   First MD Initiated Contact with Patient 04/21/22 1359     (approximate)   History   Shoulder Pain   HPI  Kevin Mccormick is a 31 y.o. male presents emergency department complaining of swelling along the area above his clavicle.  Patient denies any chest pain or shortness of breath.  No fever or chills.  States he has some numbness and tingling that goes into his arms occasionally.  States symptoms of been ongoing for about 2 months but noticed recently that the swelling started.  Did use Aleve and muscle relaxer without any relief.      Physical Exam   Triage Vital Signs: ED Triage Vitals  Enc Vitals Group     BP 04/21/22 1319 128/86     Pulse Rate 04/21/22 1319 60     Resp 04/21/22 1319 18     Temp 04/21/22 1319 (!) 97.5 F (36.4 C)     Temp src --      SpO2 04/21/22 1319 97 %     Weight 04/21/22 1330 200 lb (90.7 kg)     Height 04/21/22 1330 5\' 7"  (1.702 m)     Head Circumference --      Peak Flow --      Pain Score 04/21/22 1330 9     Pain Loc --      Pain Edu? --      Excl. in GC? --     Most recent vital signs: Vitals:   04/21/22 1319  BP: 128/86  Pulse: 60  Resp: 18  Temp: (!) 97.5 F (36.4 C)  SpO2: 97%     General: Awake, no distress.   CV:  Good peripheral perfusion. regular rate and  rhythm Resp:  Normal effort. Lungs CTA Abd:  No distention.   Other:  No bony tenderness appreciated, there is swelling noted above the left clavicle only.  Area is soft and more fluctuant, neurovascular is intact   ED Results / Procedures / Treatments   Labs (all labs ordered are listed, but only abnormal results are displayed) Labs Reviewed - No data to display   EKG     RADIOLOGY Chest x-ray    PROCEDURES:   Procedures   MEDICATIONS ORDERED IN ED: Medications - No data to display   IMPRESSION / MDM / ASSESSMENT AND PLAN / ED COURSE  I reviewed the triage vital  signs and the nursing notes.                              Differential diagnosis includes, but is not limited to, muscle strain, mass, infection  Due to the swelling just on the left side I will do a chest x-ray.  Plan at this time is to evaluate chest x-ray.    Chest x-ray was interpreted by me as being negative.  Confirmed by radiology  Due to the swelling I think patient should follow-up with orthopedics.  Do not feel this is a carcinoma or lung problem.  Patient is in agreement treatment plan.  Take Tylenol and ibuprofen.  Apply ice.  If the area decreases then he knows that this inflammation from working out too hard.  Otherwise he needs to follow-up with orthopedics for further evaluation.        FINAL CLINICAL IMPRESSION(S) / ED DIAGNOSES   Final diagnoses:  Acute pain of both shoulders  Swelling of shoulder region     Rx / DC Orders   ED Discharge Orders     None        Note:  This document was prepared using Dragon voice recognition software and may include unintentional dictation errors.    Faythe Ghee, PA-C 04/21/22 1531    Concha Se, MD 04/22/22 [phone]

## 2022-04-21 NOTE — Discharge Instructions (Signed)
Follow-up with orthopedics.  Please call them for an appointment.  Take Tylenol and ibuprofen for pain.  Apply ice to the area.  Return if worsening.

## 2022-04-21 NOTE — ED Triage Notes (Signed)
Pt reports that he has been having bilt shoulder pain with numbness x 3 months. For a week pt reports "bulge" to left shoulder. Pt denies injury. Naproxen at home not helping. Was seen at [facility] and given muscle relaxer without relief as well.

## 2022-11-08 IMAGING — CR DG CHEST 2V
2 series · 2 of 2 positions shown · non-contrast
Comparison: Chest radiograph 05/04/2021.

CLINICAL DATA: Provided history: Swelling in left anterior
neck/shoulder. Additional history provided: Patient reports
bilateral shoulder pain with numbness for 3 months. Patient reports
a left shoulder "bulge" for 1 week.

EXAM:
CHEST - 2 VIEW

[chest pa]
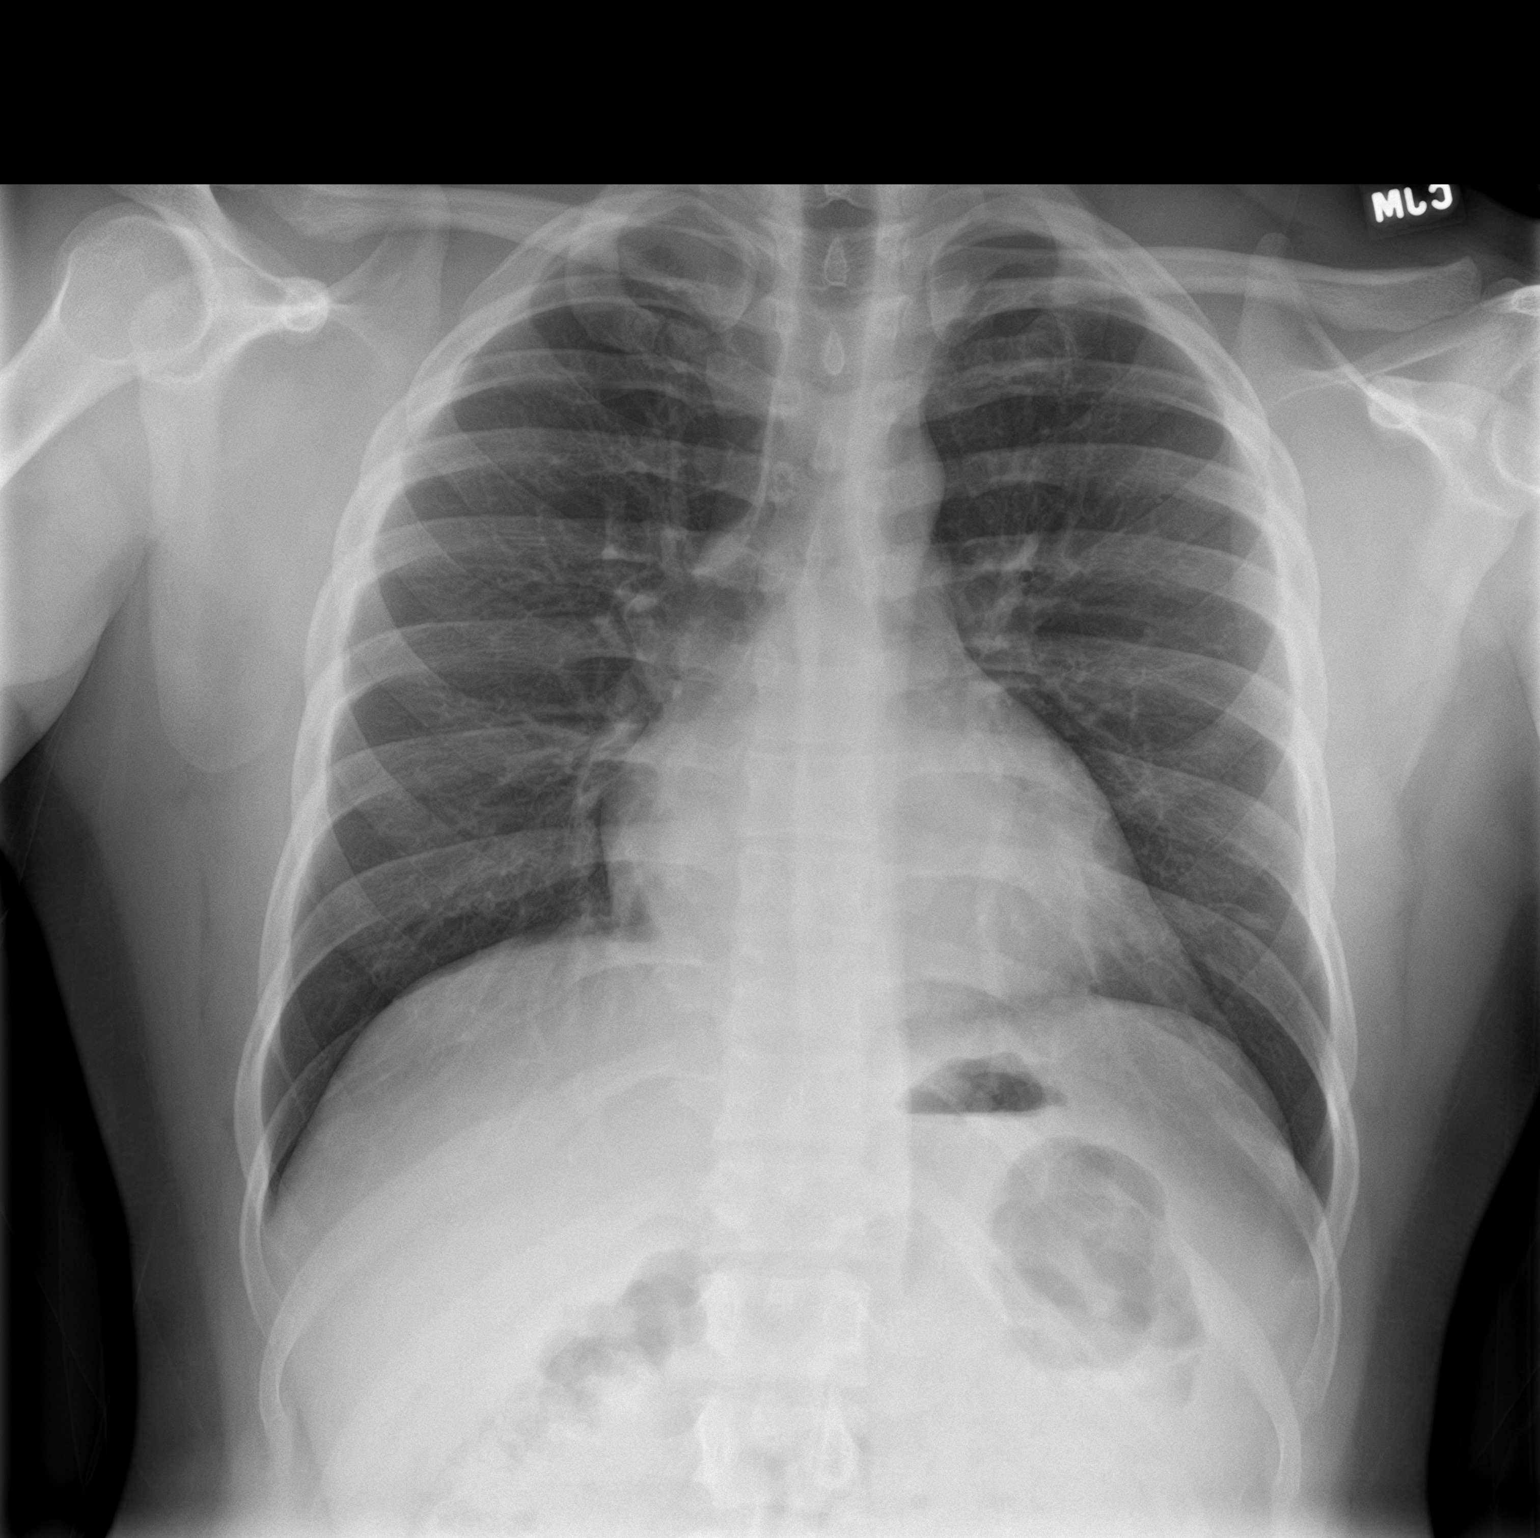

[chest lat]
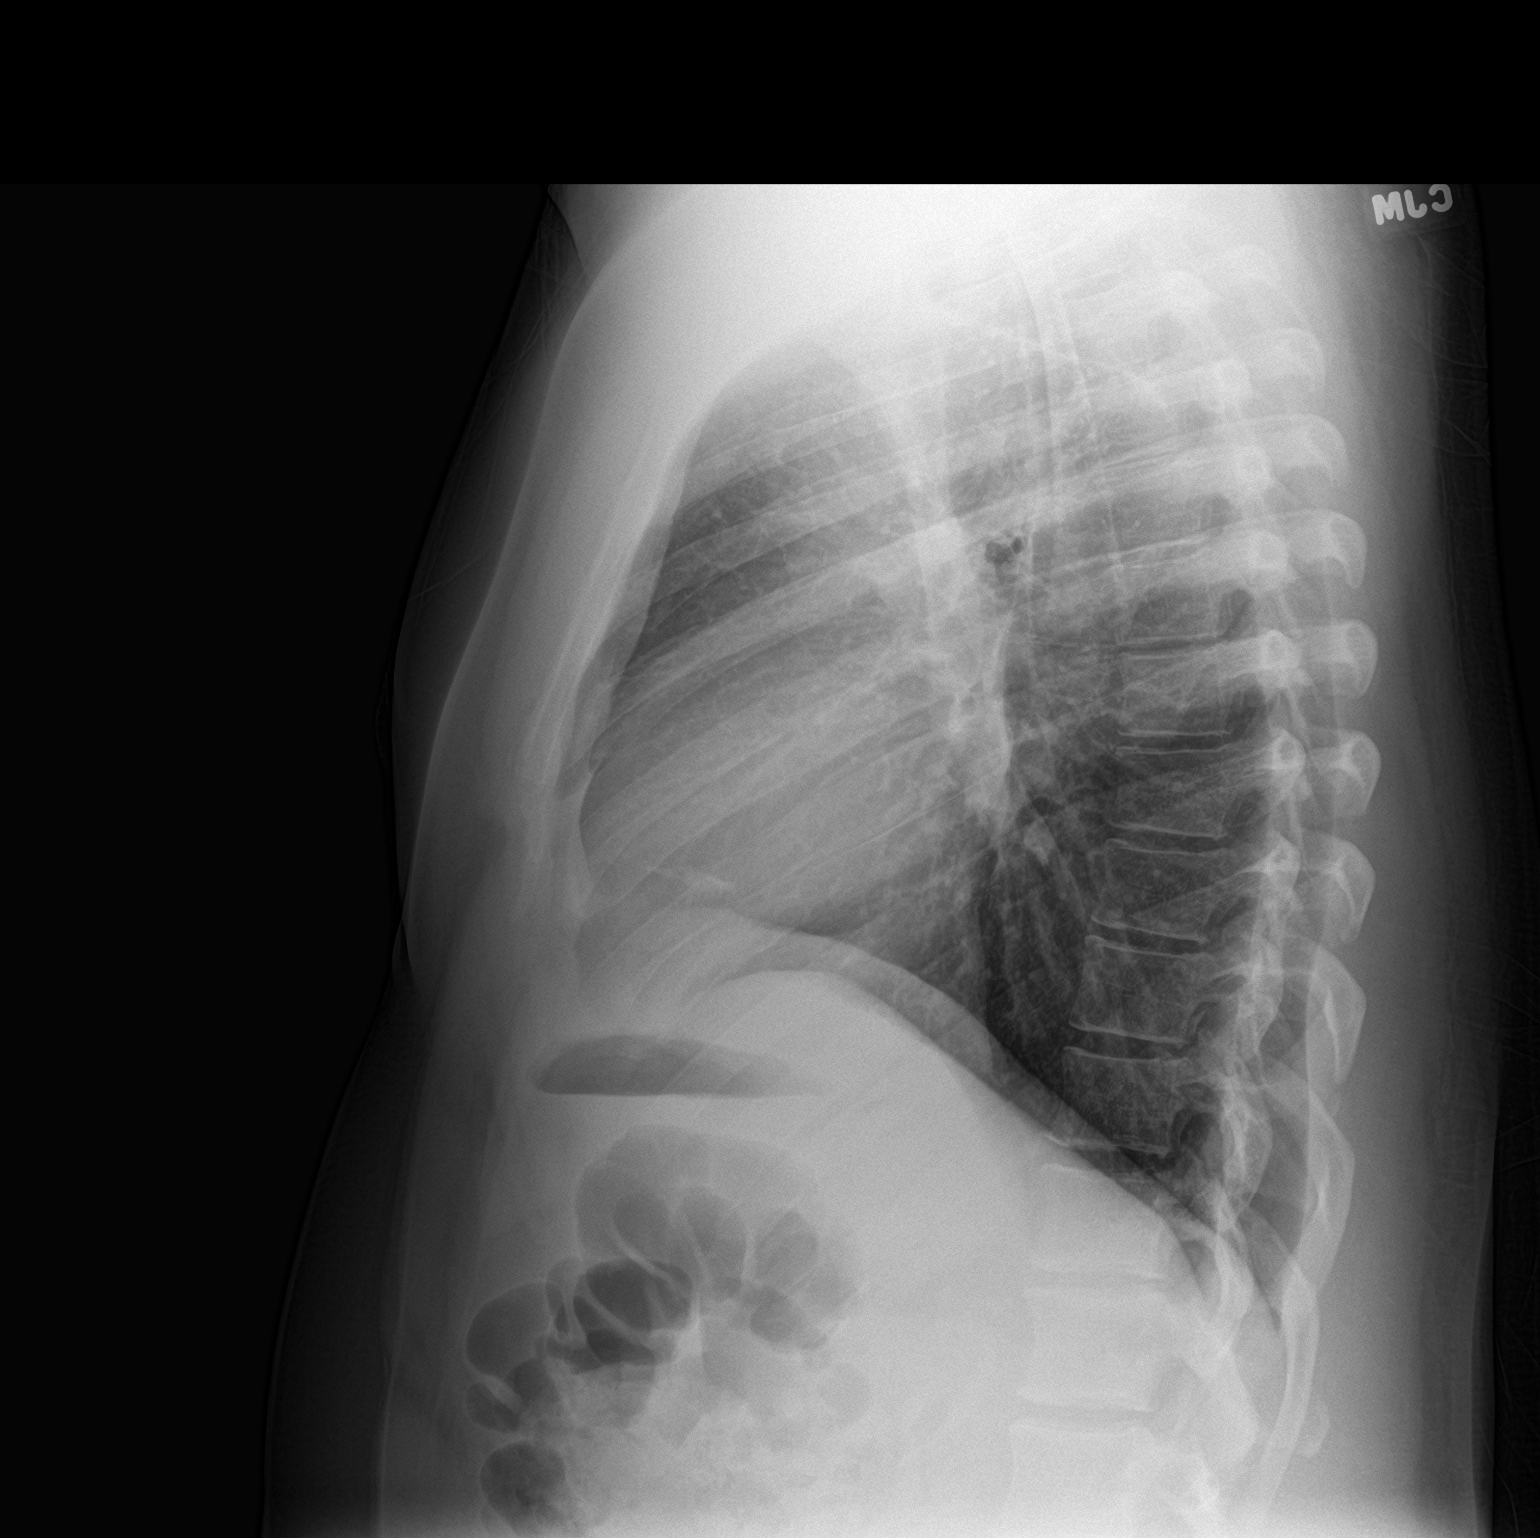

[2 of 2 positions shown; findings below may reference images not displayed]

FINDINGS: Heart size within normal limits. No appreciable airspace
consolidation. Redemonstrated small linear focus of scarring within
the lateral left lung base. No evidence of pleural effusion or
pneumothorax. No acute bony abnormality identified.
IMPRESSION: No evidence of acute cardiopulmonary abnormality.

Redemonstrated small linear focus of scarring within the lateral
left lung base.

## 2022-11-18 ENCOUNTER — Encounter: Payer: Self-pay | Admitting: Emergency Medicine

## 2022-11-18 ENCOUNTER — Emergency Department
Admission: EM | Admit: 2022-11-18 | Discharge: 2022-11-18 | Disposition: A | Discharge status: Home / Self Care | Payer: Self-pay | Attending: Emergency Medicine | Admitting: Emergency Medicine

## 2022-11-18 ENCOUNTER — Emergency Department: Payer: Self-pay

## 2022-11-18 ENCOUNTER — Other Ambulatory Visit: Payer: Self-pay

## 2022-11-18 DIAGNOSIS — J101 Influenza due to other identified influenza virus with other respiratory manifestations: Secondary | ICD-10-CM | POA: Insufficient documentation

## 2022-11-18 DIAGNOSIS — Z1152 Encounter for screening for COVID-19: Secondary | ICD-10-CM | POA: Insufficient documentation

## 2022-11-18 LAB — RESP PANEL BY RT-PCR (RSV, FLU A&B, COVID)  RVPGX2
Influenza A by PCR: POSITIVE — AB
Influenza B by PCR: NEGATIVE
Resp Syncytial Virus by PCR: NEGATIVE
SARS Coronavirus 2 by RT PCR: NEGATIVE

## 2022-11-18 MED ORDER — ACETAMINOPHEN 325 MG PO TABS
ORAL_TABLET | ORAL | Status: AC
  Filled 2022-11-18: qty 2

## 2022-11-18 MED ORDER — IBUPROFEN 600 MG PO TABS
600.0000 mg | ORAL_TABLET | Freq: Once | ORAL | Status: AC
  Administered 2022-11-18: 600 mg via ORAL
  Filled 2022-11-18: qty 1

## 2022-11-18 MED ORDER — ACETAMINOPHEN 325 MG PO TABS
650.0000 mg | ORAL_TABLET | Freq: Once | ORAL | Status: AC | PRN
  Administered 2022-11-18: 650 mg via ORAL

## 2022-11-18 NOTE — ED Triage Notes (Signed)
Patient arrives ambulatory c/o cough x 1 week. Reports feeling warm last night but did not check for fever. Patient reports trying OTC meds.

## 2022-11-18 NOTE — ED Notes (Signed)
See triage note  Presents with fever,body aches and cough  States he had similar sx's about 1 week ago

## 2022-11-18 NOTE — Discharge Instructions (Signed)
Drink plenty of fluids to stay well-hydrated.  Find Pedialyte or similar electrolyte rehydration formulas at your local pharmacy.  Take acetaminophen 650 mg and ibuprofen 400 mg every 6 hours for fever/aches/pains.  Take with food.  Thank you for choosing [place] for your health care today!  Please see your primary doctor this week for a follow up appointment.   Sometimes, in the early stages of certain disease courses it is difficult to detect in the emergency department evaluation -- so, it is important that you continue to monitor your symptoms and call your doctor right away or return to the emergency department if you develop any new or worsening symptoms.  Please go to the following website to schedule new (and existing) patient appointments:   [email or web address]  If you do not have a primary doctor try calling the following clinics to establish care:  If you have insurance:  [facility] [phone] [address] [place] [place] [address]   [facility]  [phone] [address] [place] [place] [address]   If you do not have insurance:  [facility]  [phone] [address] [place] [address]   The following is another list of primary care offices in the area who are accepting new patients at this time.  Please reach out to one of them directly and let them know you would like to schedule an appointment to follow up on an Emergency Department visit, and/or to establish a new primary care provider (PCP).  There are likely other primary care clinics in the are who are accepting new patients, but this is an excellent place to start:  [facility] Lead physician: Dr Shirlee Latch [address] [place], [place] [address] [phone]  [facility] Lead Physician: Dr Alba Cory [address], [place], [place] [address] [phone]  [facility]  Lead Physician: Dr Olevia Perches [address] [place], [place] [address] [phone]  [facility] Lead Physician: Dr Sofie Hartigan [address] [place], [place], [place] [address] [phone]  [facility] at [facility] Lead Physician: Dr Bari Edward [address] [place], [place] [address] [phone]   It was my pleasure to care for you today.   Daneil Dan Modesto Charon, MD

## 2022-11-18 NOTE — ED Provider Notes (Signed)
[facility] Provider Note    Event Date/Time   First MD Initiated Contact with Patient 11/18/22 [phone]     (approximate)   History   Cough   HPI  Kevin Mccormick is a 31 y.o. male   Past medical history of otherwise healthy young man who presents with 1 week of upper respiratory infectious symptoms.  Started off early last week with a mild cough which mostly resolved and then about 2 days ago developed worsening cough, myalgias, headache, fever.  Also some runny nose.  No known sick contacts.  No COVID or flu vaccinations.    No other acute medical complaints.  Denies urinary symptoms, testicular pain, vomiting, diarrhea.     History was obtained via the patient. I reviewed external medical notes including an emergency department visit dated 05/04/2021 when he was diagnosed with viral URI and negative chest x-ray.     Physical Exam   Triage Vital Signs: ED Triage Vitals  Enc Vitals Group     BP 11/18/22 0749 (!) 107/52     Pulse Rate 11/18/22 0749 (!) 114     Resp 11/18/22 0749 18     Temp 11/18/22 0749 (!) 103 F (39.4 C)     Temp Source 11/18/22 0749 Oral     SpO2 11/18/22 0749 98 %     Weight 11/18/22 0750 195 lb (88.5 kg)     Height 11/18/22 0750 5\' 7"  (1.702 m)     Head Circumference --      Peak Flow --      Pain Score 11/18/22 0800 4     Pain Loc --      Pain Edu? --      Excl. in GC? --     Most recent vital signs: Vitals:   11/18/22 0749 11/18/22 0910  BP: (!) 107/52 110/60  Pulse: (!) 114 90  Resp: 18 18  Temp: (!) 103 F (39.4 C) 99.7 F (37.6 C)  SpO2: 98% 98%    General: Awake, no distress.  CV:  Good peripheral perfusion.  Resp:  Normal effort.  Abd:  No distention.  Other:  Awake alert oriented and pleasant.  Nontoxic-appearing neck is supple with full range of motion, speaking in full sentences.  Lungs clear to auscultation without wheezing focality or rales.  Skin appears warm and well-perfused.  He is febrile and  mildly tachycardic.  He appears euvolemic.   ED Results / Procedures / Treatments   Labs (all labs ordered are listed, but only abnormal results are displayed) Labs Reviewed  RESP PANEL BY RT-PCR (RSV, FLU A&B, COVID)  RVPGX2 - Abnormal; Notable for the following components:      Result Value   Influenza A by PCR POSITIVE (*)    All other components within normal limits     I reviewed labs and they are notable for positive for influenza A  RADIOLOGY I independently reviewed and interpreted chest x-ray and see no obvious focal consolidations or pneumothorax   PROCEDURES:  Critical Care performed: No  Procedures   MEDICATIONS ORDERED IN ED: Medications  acetaminophen (TYLENOL) tablet 650 mg (650 mg Oral Given 11/18/22 0804)  ibuprofen (ADVIL) tablet 600 mg (600 mg Oral Given 11/18/22 0826)    IMPRESSION / MDM / ASSESSMENT AND PLAN / ED COURSE  I reviewed the triage vital signs and the nursing notes.  Differential diagnosis includes, but is not limited to, viral URI, bacterial pneumonia, considered but less likely more emergent pathologies like meningitis, sepsis.    MDM: Patient with viral URI symptoms with a negative chest x-ray, and otherwise well-appearing I doubt clinically sepsis or meningitis - more likely viral URI.Marland Kitchen  He does have a fever and some mild tachycardia.  Given antipyretics in the emergency department as well as viral testing and a chest x-ray.  Antivirals risks benefits and side effect profiles and the patient declines antiviral medication if he is positive on viral testing.  Positive for influenza A.  I gave anticipatory guidance recommendations and PMD follow-up, and discussed return precautions.   Patient's presentation is most consistent with acute presentation with potential threat to life or bodily function.       FINAL CLINICAL IMPRESSION(S) / ED DIAGNOSES   Final diagnoses:  Influenza A     Rx / DC  Orders   ED Discharge Orders     None        Note:  This document was prepared using Dragon voice recognition software and may include unintentional dictation errors.Pilar Jarvis, MD 11/18/22 [phone]

## 2022-11-21 ENCOUNTER — Emergency Department: Payer: Self-pay

## 2022-11-21 ENCOUNTER — Other Ambulatory Visit: Payer: Self-pay

## 2022-11-21 ENCOUNTER — Emergency Department
Admission: EM | Admit: 2022-11-21 | Discharge: 2022-11-21 | Disposition: A | Discharge status: Home / Self Care | Payer: Self-pay | Attending: Emergency Medicine | Admitting: Emergency Medicine

## 2022-11-21 DIAGNOSIS — J101 Influenza due to other identified influenza virus with other respiratory manifestations: Secondary | ICD-10-CM | POA: Insufficient documentation

## 2022-11-21 DIAGNOSIS — Z1152 Encounter for screening for COVID-19: Secondary | ICD-10-CM | POA: Insufficient documentation

## 2022-11-21 LAB — COMPREHENSIVE METABOLIC PANEL
ALT: 25 U/L (ref 0–44)
AST: 33 U/L (ref 15–41)
Albumin: 4.1 g/dL (ref 3.5–5.0)
Alkaline Phosphatase: 57 U/L (ref 38–126)
Anion gap: 9 (ref 5–15)
BUN: 10 mg/dL (ref 6–20)
CO2: 25 mmol/L (ref 22–32)
Calcium: 8.8 mg/dL — ABNORMAL LOW (ref 8.9–10.3)
Chloride: 103 mmol/L (ref 98–111)
Creatinine, Ser: 1.27 mg/dL — ABNORMAL HIGH (ref 0.61–1.24)
GFR, Estimated: >60 mL/min (ref >60)
Glucose, Bld: 114 mg/dL — ABNORMAL HIGH (ref 70–99)
Potassium: 3.3 mmol/L — ABNORMAL LOW (ref 3.5–5.1)
Sodium: 137 mmol/L (ref 135–145)
Total Bilirubin: 1.1 mg/dL (ref 0.3–1.2)
Total Protein: 7.4 g/dL (ref 6.5–8.1)

## 2022-11-21 LAB — URINALYSIS, ROUTINE W REFLEX MICROSCOPIC
Bilirubin Urine: NEGATIVE
Glucose, UA: NEGATIVE mg/dL
Hgb urine dipstick: NEGATIVE
Ketones, ur: 80 mg/dL — AB
Leukocytes,Ua: NEGATIVE
Nitrite: NEGATIVE
Protein, ur: 30 mg/dL — AB
Specific Gravity, Urine: 1.029 (ref 1.005–1.030)
Squamous Epithelial / HPF: NONE SEEN (ref 0–5)
pH: 5 (ref 5.0–8.0)

## 2022-11-21 LAB — RESP PANEL BY RT-PCR (RSV, FLU A&B, COVID)  RVPGX2
Influenza A by PCR: POSITIVE — AB
Influenza B by PCR: NEGATIVE
Resp Syncytial Virus by PCR: NEGATIVE
SARS Coronavirus 2 by RT PCR: NEGATIVE

## 2022-11-21 LAB — CBC WITH DIFFERENTIAL/PLATELET
Abs Immature Granulocytes: 0.02 10*3/uL (ref 0.00–0.07)
Basophils Absolute: 0 10*3/uL (ref 0.0–0.1)
Basophils Relative: 0 %
Eosinophils Absolute: 0 10*3/uL (ref 0.0–0.5)
Eosinophils Relative: 0 %
HCT: 44.4 % (ref 39.0–52.0)
Hemoglobin: 15.2 g/dL (ref 13.0–17.0)
Immature Granulocytes: 0 %
Lymphocytes Relative: 6 %
Lymphs Abs: 0.5 10*3/uL — ABNORMAL LOW (ref 0.7–4.0)
MCH: 29.5 pg (ref 26.0–34.0)
MCHC: 34.2 g/dL (ref 30.0–36.0)
MCV: 86 fL (ref 80.0–100.0)
Monocytes Absolute: 0.4 10*3/uL (ref 0.1–1.0)
Monocytes Relative: 6 %
Neutro Abs: 6.7 10*3/uL (ref 1.7–7.7)
Neutrophils Relative %: 88 %
Platelets: 266 10*3/uL (ref 150–400)
RBC: 5.16 MIL/uL (ref 4.22–5.81)
RDW: 13.5 % (ref 11.5–15.5)
WBC: 7.7 10*3/uL (ref 4.0–10.5)
nRBC: 0 % (ref 0.0–0.2)

## 2022-11-21 LAB — LIPASE, BLOOD: Lipase: 32 U/L (ref 11–51)

## 2022-11-21 MED ORDER — LACTATED RINGERS IV BOLUS
1000.0000 mL | Freq: Once | INTRAVENOUS | Status: AC
  Administered 2022-11-21: 1000 mL via INTRAVENOUS

## 2022-11-21 MED ORDER — ONDANSETRON 4 MG PO TBDP: 4.0000 mg | ORAL_TABLET | Freq: Three times a day (TID) | ORAL | 0 refills | Status: AC | PRN

## 2022-11-21 MED ORDER — ONDANSETRON HCL 4 MG/2ML IJ SOLN
4.0000 mg | Freq: Once | INTRAMUSCULAR | Status: AC
  Administered 2022-11-21: 4 mg via INTRAVENOUS
  Filled 2022-11-21: qty 2

## 2022-11-21 MED ORDER — ACETAMINOPHEN 500 MG PO TABS
1000.0000 mg | ORAL_TABLET | Freq: Once | ORAL | Status: AC
  Administered 2022-11-21: 1000 mg via ORAL
  Filled 2022-11-21: qty 2

## 2022-11-21 NOTE — ED Provider Triage Note (Signed)
  Emergency Medicine Provider Triage Evaluation Note  Kevin Mccormick , a 31 y.o.male,  was evaluated in triage.  Pt complains of emesis, fever.  He states that he was here yesterday and diagnosed with influenza.  Since then he has had some abdominal cramping and persistent nausea/vomiting.   Review of Systems  Positive: Nausea, vomiting Negative: Denies fever, chest pain, neck pain.  Physical Exam   Vitals:   11/21/22 1511  BP: 101/68  Pulse: (!) 114  Resp: (!) 24  Temp: (!) 103.2 F (39.6 C)  SpO2: 93%   Gen:   Awake, no distress   Resp:  Normal effort  MSK:   Moves extremities without difficulty  Other:    Medical Decision Making  Given the patient's initial medical screening exam, the following diagnostic evaluation has been ordered. The patient will be placed in the appropriate treatment space, once one is available, to complete the evaluation and treatment. I have discussed the plan of care with the patient and I have advised the patient that an ED physician or mid-level practitioner will reevaluate their condition after the test results have been received, as the results may give them additional insight into the type of treatment they may need.    Diagnostics: Labs, UA,  Treatments: IV fluids, ondansetron.   Varney Daily, [place] 11/21/22 [phone]

## 2022-11-21 NOTE — Discharge Instructions (Signed)
Follow-up with regular doctor if not improved in 3 days.  Return emergency department worsening.  Use medications as prescribed. The [facility] on [address] will be open tomorrow from 10 AM to 2 PM.  Please pick up your nausea medication at that time

## 2022-11-21 NOTE — ED Provider Notes (Signed)
[facility] Provider Note    Event Date/Time   First MD Initiated Contact with Patient 11/21/22 1812     (approximate)   History   Emesis, Cough, and Nasal Congestion   HPI  Kevin Mccormick is a 31 y.o. male with recent positive flu test on the 21st presents emergency department complaining of continued cough, body aches congestion and vomiting.  Unable to retain any fluids.  States he was not given any medication when he was diagnosed with flu.  Denies chest pain or shortness of breath.  States abdominal pain only with vomiting.      Physical Exam   Triage Vital Signs: ED Triage Vitals  Enc Vitals Group     BP 11/21/22 1511 101/68     Pulse Rate 11/21/22 1511 (!) 114     Resp 11/21/22 1511 (!) 24     Temp 11/21/22 1511 (!) 103.2 F (39.6 C)     Temp Source 11/21/22 1511 Oral     SpO2 11/21/22 1511 93 %     Weight 11/21/22 1512 194 lb 0.1 oz (88 kg)     Height 11/21/22 1512 5\' 7"  (1.702 m)     Head Circumference --      Peak Flow --      Pain Score 11/21/22 1512 0     Pain Loc --      Pain Edu? --      Excl. in GC? --     Most recent vital signs: Vitals:   11/21/22 1511 11/21/22 1821  BP: 101/68 104/68  Pulse: (!) 114 98  Resp: (!) 24 20  Temp: (!) 103.2 F (39.6 C) 99.2 F (37.3 C)  SpO2: 93% 95%     General: Awake, no distress.   CV:  Good peripheral perfusion. regular rate and  rhythm Resp:  Normal effort. Lungs cta Abd:  No distention.  Nontender Other:      ED Results / Procedures / Treatments   Labs (all labs ordered are listed, but only abnormal results are displayed) Labs Reviewed  CBC WITH DIFFERENTIAL/PLATELET - Abnormal; Notable for the following components:      Result Value   Lymphs Abs 0.5 (*)    All other components within normal limits  COMPREHENSIVE METABOLIC PANEL - Abnormal; Notable for the following components:   Potassium 3.3 (*)    Glucose, Bld 114 (*)    Creatinine, Ser 1.27 (*)    Calcium 8.8  (*)    All other components within normal limits  URINALYSIS, ROUTINE W REFLEX MICROSCOPIC - Abnormal; Notable for the following components:   Color, Urine YELLOW (*)    APPearance HAZY (*)    Ketones, ur 80 (*)    Protein, ur 30 (*)    Bacteria, UA RARE (*)    All other components within normal limits  RESP PANEL BY RT-PCR (RSV, FLU A&B, COVID)  RVPGX2  LIPASE, BLOOD     EKG     RADIOLOGY Chest x-ray    PROCEDURES:   Procedures   MEDICATIONS ORDERED IN ED: Medications  ondansetron (ZOFRAN) injection 4 mg (4 mg Intravenous Given 11/21/22 1524)  lactated ringers bolus 1,000 mL (0 mLs Intravenous Stopped 11/21/22 1822)  acetaminophen (TYLENOL) tablet 1,000 mg (1,000 mg Oral Given 11/21/22 1524)     IMPRESSION / MDM / ASSESSMENT AND PLAN / ED COURSE  I reviewed the triage vital signs and the nursing notes.  Differential diagnosis includes, but is not limited to, influenza, COVID, RSV, gastroenteritis, dehydration, CAP  Patient's presentation is most consistent with acute complicated illness / injury requiring diagnostic workup.   Patient's labs are reassuring, does show some mild dehydration but patient was given lactated Ringer's 1 L IV and Zofran milligrams while in triage  Patient is stating that he feels better, states he does not feel as tired and weak.  Chest x-ray was independently reviewed and interpreted by me as being negative for pneumonia  Therefore I do not feel that the patient needs to be admitted.  He appears nontoxic.  I do feel that he was rehydrated with a 1 L lactated Ringer's.  He is to pick up a prescription for Zofran ODT.  Did explain to him which pharmacy would be open tomorrow.  He is to eat crackers, drink ginger ale and eat a very bland diet to help control vomiting.  Tylenol and ibuprofen for fever.  Drink plenty of fluids.  Patient is in agreement treatment plan.  He was discharged stable condition.       FINAL CLINICAL IMPRESSION(S) / ED DIAGNOSES   Final diagnoses:  Influenza A     Rx / DC Orders   ED Discharge Orders          Ordered    ondansetron (ZOFRAN-ODT) 4 MG disintegrating tablet  Every 8 hours PRN        11/21/22 1828             Note:  This document was prepared using Dragon voice recognition software and may include unintentional dictation errors.    Faythe Ghee, PA-C 11/21/22 1832    Minna Antis, MD 11/21/22 1946

## 2022-11-21 NOTE — ED Triage Notes (Signed)
Pt to ED for flulike symptoms including cough, body aches, congestion, and emesis, states cannot keep anything down and feels dehydrated since several days ago, seen here at onset of illness and was told had influenza. States is not feeling any better. Vomited about 4-5 times today. Feels weak from vomiting, requesting IV fluids. PA at bedside in triage.

## 2023-05-26 ENCOUNTER — Emergency Department (HOSPITAL_BASED_OUTPATIENT_CLINIC_OR_DEPARTMENT_OTHER)
Admission: EM | Admit: 2023-05-26 | Discharge: 2023-05-26 | Discharge status: Against Medical Advice | Payer: Self-pay | Attending: Emergency Medicine | Admitting: Emergency Medicine

## 2023-05-26 ENCOUNTER — Other Ambulatory Visit: Payer: Self-pay

## 2023-05-26 ENCOUNTER — Encounter (HOSPITAL_BASED_OUTPATIENT_CLINIC_OR_DEPARTMENT_OTHER): Payer: Self-pay

## 2023-05-26 DIAGNOSIS — M25512 Pain in left shoulder: Secondary | ICD-10-CM | POA: Insufficient documentation

## 2023-05-26 DIAGNOSIS — M7989 Other specified soft tissue disorders: Secondary | ICD-10-CM | POA: Insufficient documentation

## 2023-05-26 DIAGNOSIS — M549 Dorsalgia, unspecified: Secondary | ICD-10-CM | POA: Insufficient documentation

## 2023-05-26 NOTE — ED Provider Notes (Signed)
[place] EMERGENCY DEPARTMENT AT [facility] Provider Note   CSN: [id number] Arrival date & time: 05/26/23  1931     History  Chief Complaint  Patient presents with   Shoulder Pain    Kevin Mccormick is a 32 y.o. male who presents to ED concerned for left shoulder pain/swelling for the past 8 months. Pain radiates to upper back and left axilla. Reports that past chest xray was negative for acute concern. States he has seen multiple providers who have been prescribing him muscle relaxers to help with pain. Patient believes swelling is from over-exerting himself while working out.  Denies chest pain, dyspnea, fever, dysphagia   Shoulder Pain Associated symptoms: back pain        Home Medications Prior to Admission medications   Medication Sig Start Date End Date Taking? Authorizing Provider  ondansetron (ZOFRAN-ODT) 4 MG disintegrating tablet Take 1 tablet (4 mg total) by mouth every 8 (eight) hours as needed. 11/21/22   Fisher, Roselyn Bering, PA-C  pantoprazole (PROTONIX) 40 MG tablet Take 1 tablet (40 mg total) by mouth daily. 09/10/19 05/23/20  Minna Antis, MD      Allergies    Patient has no known allergies.    Review of Systems   Review of Systems  Musculoskeletal:  Positive for back pain.    Physical Exam Updated Vital Signs BP 119/79 (BP Location: Right Arm)   Pulse 69   Temp 97.6 F (36.4 C)   Resp 18   Ht 5\' 7"  (1.702 m)   Wt 88.5 kg   SpO2 100%   BMI 30.54 kg/m  Physical Exam Vitals and nursing note reviewed.  Constitutional:      General: He is not in acute distress.    Appearance: He is not ill-appearing or toxic-appearing.  HENT:     Head: Normocephalic and atraumatic.  Eyes:     General: No scleral icterus.       Right eye: No discharge.        Left eye: No discharge.     Conjunctiva/sclera: Conjunctivae normal.  Cardiovascular:     Rate and Rhythm: Normal rate.  Pulmonary:     Effort: Pulmonary effort is normal.  Abdominal:      General: Abdomen is flat.  Musculoskeletal:     Comments: 6cm soft, mildly raised area appears to be associated with left lower scalene muscle area. No erythema or fluctuance appreciated. +2 radial pulses BL and sensation to light touch intact. Left arm is non-tense.  Skin:    General: Skin is warm and dry.  Neurological:     General: No focal deficit present.     Mental Status: He is alert. Mental status is at baseline.  Psychiatric:        Mood and Affect: Mood normal.        Behavior: Behavior normal.     ED Results / Procedures / Treatments   Labs (all labs ordered are listed, but only abnormal results are displayed) Labs Reviewed  CBC  BASIC METABOLIC PANEL    EKG None  Radiology No results found.  Procedures Procedures    Medications Ordered in ED Medications - No data to display  ED Course/ Medical Decision Making/ A&P                             Medical Decision Making Amount and/or Complexity of Data Reviewed Labs: ordered. Radiology: ordered.     Co  morbidities that complicate the patient evaluation  none  Problem List / ED Course / Critical interventions / Medication management  Patient presents to ED concerned for shoulder/back swelling and pain. Ordered CT to assess for process that would cause these symptoms since patient has never had imaging that successfully evaluated area before. Patient left AMA before CT could be obtained - stating that he had to go to work. I was not made aware that patient was leaving, so I was not able to talk to him before he left. I have reviewed the patients home medicines and have made adjustments as needed   Social Determinants of Health:  none          Final Clinical Impression(s) / ED Diagnoses Final diagnoses:  Shoulder swelling    Rx / DC Orders ED Discharge Orders     None         Margarita Rana 05/27/23 0208    Terrilee Files, MD 05/27/23 [phone]

## 2023-05-26 NOTE — ED Notes (Signed)
Patient refused labs and imaging. Stating " I'm going to leave, I have to go to work tomorrow" Patient refused to talk to provider again and verbalized understanding that he was leaving prior to full eval and treatment being completed. Patient a&ox4, steady gait

## 2023-05-26 NOTE — ED Triage Notes (Signed)
POV from home, A&O x 4, GCS 15, amb to triage.  C/o shoulder pain/swelling/knot in left shoulder x 8 months. Pain radiates to upper back and underarm. Noticeable swelling to left shoulder. Seen in ER for same in past and sts XR were normal and muscle relaxers aren't helping with swelling.

## 2024-03-02 ENCOUNTER — Encounter (HOSPITAL_BASED_OUTPATIENT_CLINIC_OR_DEPARTMENT_OTHER): Payer: Self-pay

## 2024-03-02 ENCOUNTER — Other Ambulatory Visit: Payer: Self-pay

## 2024-03-02 ENCOUNTER — Emergency Department (HOSPITAL_BASED_OUTPATIENT_CLINIC_OR_DEPARTMENT_OTHER)
Admission: EM | Admit: 2024-03-02 | Discharge: 2024-03-02 | Disposition: A | Discharge status: Home / Self Care | Payer: Self-pay

## 2024-03-02 DIAGNOSIS — X509XXA Other and unspecified overexertion or strenuous movements or postures, initial encounter: Secondary | ICD-10-CM | POA: Insufficient documentation

## 2024-03-02 DIAGNOSIS — S8392XA Sprain of unspecified site of left knee, initial encounter: Secondary | ICD-10-CM | POA: Insufficient documentation

## 2024-03-02 DIAGNOSIS — Y9367 Activity, basketball: Secondary | ICD-10-CM | POA: Insufficient documentation

## 2024-03-02 NOTE — Discharge Instructions (Addendum)
 Please follow-up with the orthopedic doctor.  Please call to schedule an appointment.  Return immediately for fevers, chills, redness, swelling to the knee, severe pain, inability to walk, numbness tingling changes in sensation or weakness in the leg.  Or if you develop any new or worsening symptoms that are concerning to you.

## 2024-03-02 NOTE — ED Triage Notes (Signed)
 Pt presents to ED from home C/O L knee pain X 3 weeks. Denies known injury. Taking Aleve without relief.

## 2024-03-02 NOTE — ED Provider Notes (Signed)
  EMERGENCY DEPARTMENT AT [facility] Provider Note   CSN: [id number] Arrival date & time: 03/02/24  0825     History  Chief Complaint  Patient presents with   Knee Pain    Kevin Mccormick is a 33 y.o. male.  This is a 33 year old male presenting emergency department left knee pain.  Noted symptoms for the past 3 weeks.  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  Has pain since that time.  Has tried over the counter ibuprofen.  Symptoms have not worsened, but have not improved which is why he presents today.  He notes some popping and clicking sensation in the left knee.  Sometimes the knee will buckle from underneath him.  No numbness tingling changes in sensation.  Denies fevers or chills.   Knee Pain      Home Medications Prior to Admission medications   Medication Sig Start Date End Date Taking? Authorizing Provider  ondansetron (ZOFRAN-ODT) 4 MG disintegrating tablet Take 1 tablet (4 mg total) by mouth every 8 (eight) hours as needed. 11/21/22   Fisher, Roselyn Bering, PA-C  pantoprazole (PROTONIX) 40 MG tablet Take 1 tablet (40 mg total) by mouth daily. 09/10/19 05/23/20  Minna Antis, MD      Allergies    Patient has no known allergies.    Review of Systems   Review of Systems  Physical Exam Updated Vital Signs BP 122/80   Pulse (!) 58   Temp 98.2 F (36.8 C) (Oral)   Resp 17   Ht 5\' 7"  (1.702 m)   Wt 87.1 kg   SpO2 98%   BMI 30.07 kg/m  Physical Exam Vitals and nursing note reviewed.  HENT:     Head: Normocephalic.     Nose: Nose normal.     Mouth/Throat:     Mouth: Mucous membranes are moist.  Eyes:     Conjunctiva/sclera: Conjunctivae normal.  Cardiovascular:     Rate and Rhythm: Normal rate and regular rhythm.     Pulses: Normal pulses.  Abdominal:     General: Abdomen is flat. There is no distension.     Tenderness: There is no abdominal tenderness. There is no guarding or rebound.  Musculoskeletal:      Comments: Left knee with no swelling erythema or warmth.  Some minor medial joint line tenderness.  Full ROM.  Some medial joint pain with pivot shift, no joint laxity with anterior posterior Lachman.  Skin:    Capillary Refill: Capillary refill takes less than 2 seconds.  Neurological:     Mental Status: He is alert and oriented to person, place, and time.  Psychiatric:        Mood and Affect: Mood normal.        Behavior: Behavior normal.     ED Results / Procedures / Treatments   Labs (all labs ordered are listed, but only abnormal results are displayed) Labs Reviewed - No data to display  EKG None  Radiology No results found.  Procedures Procedures    Medications Ordered in ED Medications - No data to display  ED Course/ Medical Decision Making/ A&P                                 Medical Decision Making Well-appearing 33 year old male presents emergency department with knee pain x 3 weeks.  He is afebrile vital signs overall reassuring.  Exam with likely  strain versus MCL/meniscal injury.  Low suspicion for acute bony pathology.  Offered x-ray, but with shared decision making patient declined.  Discussed supportive care with over-the-counter Tylenol ibuprofen and follow-up with orthopedics.  Agreeable to plan.  Stable for discharge at this time.         Final Clinical Impression(s) / ED Diagnoses Final diagnoses:  Sprain of left knee, unspecified ligament, initial encounter    Rx / DC Orders ED Discharge Orders     None         Coral Spikes, DO 03/02/24 1350

## 2024-03-09 ENCOUNTER — Ambulatory Visit: Payer: Self-pay | Admitting: Physician Assistant

## 2024-03-09 ENCOUNTER — Encounter: Payer: Self-pay | Admitting: Physician Assistant

## 2024-03-09 ENCOUNTER — Other Ambulatory Visit: Payer: Self-pay

## 2024-03-09 DIAGNOSIS — M25562 Pain in left knee: Secondary | ICD-10-CM

## 2024-03-09 DIAGNOSIS — G8929 Other chronic pain: Secondary | ICD-10-CM

## 2024-03-09 NOTE — Progress Notes (Signed)
   Office Visit Note   Patient: Kevin Mccormick           Date of Birth: 29-Dec-1990           MRN: [id number] Visit Date: 03/09/2024              Requested by: No referring provider defined for this encounter. PCP: Patient, No Pcp Per   Assessment & Plan: Visit Diagnoses:  1. 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  Following that he had increased pain and some feelings like his knee was giving way as well as catching sensation.  All of his pain is focused around the posterior lateral joint line.  Concern for meniscus tear.  X-rays do not show any arthritic or causes for his pain he has not improved despite taking anti-inflammatories rest and trying to keep his knee moving based on the MRI would send him to therapy or refer him to one of our physicians  Follow-Up Instructions: After MRI  Orders:  Orders Placed This Encounter  Procedures   XR KNEE 3 VIEW LEFT   No orders of the defined types were placed in this encounter.     Procedures: No procedures performed   Clinical Data: No additional findings.   Subjective: No chief complaint on file.   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  He was coaching a few weeks ago and came and twisted his left knee.  He had significant pain and mechanical symptoms after.  Has difficulty extending and flexing his knee without pain.  Feels like his knee is going to give way  Review of Systems  All other systems reviewed and are negative.    Objective: Vital Signs: There were no vitals taken for this visit.  Physical Exam Constitutional:      Appearance: Normal appearance.  Pulmonary:     Effort: Pulmonary effort is normal.  Skin:    General:  Skin is warm and dry.  Neurological:     Mental Status: He is alert.  Psychiatric:        Mood and Affect: Mood normal.        Behavior: Behavior normal.     Ortho Exam  Specialty Comments:  No specialty comments available.  Imaging: No results found.   PMFS History: There are no active problems to display for this patient.  History reviewed. No pertinent past medical history.  History reviewed. No pertinent family history.  History reviewed. No pertinent surgical history. Social History   Occupational History   Not on file  Tobacco Use   Smoking status: Former    Current packs/day: 0.00    Types: Cigarettes    Quit date: 2016    Years since quitting: 9.2   Smokeless tobacco: Never  Substance and Sexual Activity   Alcohol use: Not Currently   Drug use: Not Currently   Sexual activity: Not on file

## 2024-07-08 ENCOUNTER — Emergency Department (HOSPITAL_BASED_OUTPATIENT_CLINIC_OR_DEPARTMENT_OTHER): Payer: Self-pay | Admitting: Radiology

## 2024-07-08 ENCOUNTER — Other Ambulatory Visit: Payer: Self-pay

## 2024-07-08 ENCOUNTER — Emergency Department (HOSPITAL_BASED_OUTPATIENT_CLINIC_OR_DEPARTMENT_OTHER)
Admission: EM | Admit: 2024-07-08 | Discharge: 2024-07-08 | Disposition: A | Discharge status: Home / Self Care | Payer: Self-pay

## 2024-07-08 DIAGNOSIS — R0789 Other chest pain: Secondary | ICD-10-CM | POA: Insufficient documentation

## 2024-07-08 MED ORDER — KETOROLAC TROMETHAMINE 15 MG/ML IJ SOLN
15.0000 mg | Freq: Once | INTRAMUSCULAR | Status: AC
  Administered 2024-07-08: 15 mg via INTRAMUSCULAR
  Filled 2024-07-08: qty 1

## 2024-07-08 NOTE — ED Triage Notes (Signed)
 C/o [facility] for a while. Denies SHOB or Cough. States worse with movement.

## 2024-07-08 NOTE — Discharge Instructions (Signed)
 You can take Tylenol  Motrin  as needed for pain.  You should also take Pepcid daily for GERD.

## 2024-07-08 NOTE — ED Provider Notes (Signed)
 [facility] Provider Note   CSN: [id number] Arrival date & time: 07/08/24  1105     Patient presents with: Chest Pain   Kevin Mccormick is a 33 y.o. male.   33 year old male presents for evaluation of chest pain.  States this been going on for 2 weeks.  States it gets worse when he moves his arm and is mostly located on the right side of his chest.  Denies any shortness of breath.  States he does have a history of GERD for which she sometimes takes Pepcid for.  Denies any other symptoms or concerns at this time.   Chest Pain Associated symptoms: no abdominal pain, no back pain, no cough, no fever, no palpitations, no shortness of breath and no vomiting        Prior to Admission medications   Medication Sig Start Date End Date Taking? Authorizing Provider  ondansetron  (ZOFRAN -ODT) 4 MG disintegrating tablet Take 1 tablet (4 mg total) by mouth every 8 (eight) hours as needed. 11/21/22   Fisher, Devere ORN, PA-C  pantoprazole  (PROTONIX ) 40 MG tablet Take 1 tablet (40 mg total) by mouth daily. 09/10/19 05/23/20  Dorothyann Drivers, MD    Allergies: Patient has no known allergies.    Review of Systems  Constitutional:  Negative for chills and fever.  HENT:  Negative for ear pain and sore throat.   Eyes:  Negative for pain and visual disturbance.  Respiratory:  Negative for cough and shortness of breath.   Cardiovascular:  Positive for chest pain. Negative for palpitations.  Gastrointestinal:  Negative for abdominal pain and vomiting.  Genitourinary:  Negative for dysuria and hematuria.  Musculoskeletal:  Negative for arthralgias and back pain.  Skin:  Negative for color change and rash.  Neurological:  Negative for seizures and syncope.  All other systems reviewed and are negative.   Updated Vital Signs BP 115/71 (BP Location: Right Arm)   Pulse (!) 58   Temp 98.1 F (36.7 C) (Oral)   Resp 15   SpO2 100%   Physical Exam Vitals  and nursing note reviewed.  Constitutional:      General: He is not in acute distress.    Appearance: He is well-developed.  HENT:     Head: Normocephalic and atraumatic.  Eyes:     Conjunctiva/sclera: Conjunctivae normal.  Cardiovascular:     Rate and Rhythm: Normal rate and regular rhythm.     Heart sounds: No murmur heard. Pulmonary:     Effort: Pulmonary effort is normal. No respiratory distress.     Breath sounds: Normal breath sounds.  Chest:     Chest wall: Tenderness present.     Comments: There is right-sided chest wall tenderness to palpation Abdominal:     Palpations: Abdomen is soft.     Tenderness: There is no abdominal tenderness.  Musculoskeletal:        General: No swelling.     Cervical back: Neck supple.  Skin:    General: Skin is warm and dry.     Capillary Refill: Capillary refill takes less than 2 seconds.  Neurological:     Mental Status: He is alert.  Psychiatric:        Mood and Affect: Mood normal.     (all labs ordered are listed, but only abnormal results are displayed) Labs Reviewed - No data to display  EKG: EKG Interpretation Date/Time:  Sunday July 08 2024 11:13:51 EDT Ventricular Rate:  57 PR Interval:  180 QRS Duration:  94 QT Interval:  406 QTC Calculation: 396 R Axis:   58  Text Interpretation: Sinus rhythm Borderline T abnormalities, diffuse leads No change when compared to previous EKG from 09/10/2019 Confirmed by Gennaro Bouchard ([id number]) on 07/08/2024 11:48:27 AM  Radiology: [facility] Chest 1 View Result Date: 07/08/2024 CLINICAL DATA:  Central chest pain for a while. EXAM: CHEST  1 VIEW COMPARISON:  Radiographs 11/21/2022 and 11/18/2022. FINDINGS: 1148 hours. Lordotic positioning. The heart size and mediastinal contours are normal. The lungs are clear. There is no pleural effusion or pneumothorax. No acute osseous findings are identified. Telemetry leads overlie the chest. IMPRESSION: No evidence of active cardiopulmonary process.  Electronically Signed   By: Elsie Perone M.D.   On: 07/08/2024 11:54     Procedures   Medications Ordered in the ED  ketorolac  (TORADOL ) 15 MG/ML injection 15 mg (15 mg Intramuscular Given 07/08/24 1206)                                    Medical Decision Making Patient here for likely musculoskeletal chest pain.  Been ongoing for a while and is reproducible on exam.  X-ray and EKG are within normal limits and vitals are stable.  Also takes Pepcid only as needed for GERD.  Advised him to use this every day.  Will give him Toradol  here.  Advised Tylenol  Motrin  as needed for pain and otherwise return to the ER for new or worsening symptoms.  He feels comfortable with plan be discharged home.  Advise follow-up with PCP as needed.  Problems Addressed: Musculoskeletal chest pain: acute illness or injury  Amount and/or Complexity of Data Reviewed External Data Reviewed: notes.    Details: Prior ED records reviewed and patient last seen in April 25 for knee sprain Radiology: ordered and independent interpretation performed. Decision-making details documented in ED Course.    Details: Ordered and interpreted by me independently of radiology Chest x-ray: Shows no acute abnormality in the chest ECG/medicine tests: ordered and independent interpretation performed. Decision-making details documented in ED Course.    Details: Ordered and interpreted by me in the absence of cardiology and shows sinus rhythm, no acute change when compared to previous EKG and no STEMI  Risk OTC drugs. Prescription drug management.    Final diagnoses:  Musculoskeletal chest pain    ED Discharge Orders     None          Gennaro Bouchard CROME, DO 07/08/24 1209

## 2024-10-01 ENCOUNTER — Encounter: Payer: Self-pay | Admitting: Radiology
